# Patient Record
Sex: Male | Born: 1985 | Race: White | Hispanic: No | Marital: Married | Smoking: Never smoker
Health system: Southern US, Community
[De-identification: ages and names within clinical notes are randomized; demographics above are authoritative.]

## PROBLEM LIST (undated history)

## (undated) DIAGNOSIS — F909 Attention-deficit hyperactivity disorder, unspecified type: Secondary | ICD-10-CM

## (undated) DIAGNOSIS — M62838 Other muscle spasm: Secondary | ICD-10-CM

## (undated) DIAGNOSIS — Z87442 Personal history of urinary calculi: Secondary | ICD-10-CM

## (undated) DIAGNOSIS — M778 Other enthesopathies, not elsewhere classified: Secondary | ICD-10-CM

## (undated) DIAGNOSIS — J399 Disease of upper respiratory tract, unspecified: Secondary | ICD-10-CM

## (undated) HISTORY — PX: VASECTOMY: SHX75

## (undated) HISTORY — DX: Personal history of urinary calculi: Z87.442

## (undated) HISTORY — DX: Other muscle spasm: M62.838

## (undated) HISTORY — DX: Other enthesopathies, not elsewhere classified: M77.8

## (undated) HISTORY — DX: Attention-deficit hyperactivity disorder, unspecified type: F90.9

## (undated) HISTORY — DX: Disease of upper respiratory tract, unspecified: J39.9

---
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
## 2013-04-28 ENCOUNTER — Ambulatory Visit: Payer: Self-pay | Admitting: Family Medicine

## 2016-07-10 DIAGNOSIS — K219 Gastro-esophageal reflux disease without esophagitis: Secondary | ICD-10-CM | POA: Insufficient documentation

## 2016-07-26 ENCOUNTER — Encounter: Payer: Self-pay | Admitting: Family Medicine

## 2016-07-26 ENCOUNTER — Ambulatory Visit (INDEPENDENT_AMBULATORY_CARE_PROVIDER_SITE_OTHER): Payer: Self-pay | Admitting: Family Medicine

## 2016-07-26 VITALS — BP 118/84 | HR 108 | Temp 98.9°F | Resp 18 | Ht 71.0 in | Wt 216.0 lb

## 2016-07-26 DIAGNOSIS — J399 Disease of upper respiratory tract, unspecified: Secondary | ICD-10-CM | POA: Insufficient documentation

## 2016-07-26 DIAGNOSIS — M778 Other enthesopathies, not elsewhere classified: Secondary | ICD-10-CM

## 2016-07-26 DIAGNOSIS — J019 Acute sinusitis, unspecified: Secondary | ICD-10-CM | POA: Insufficient documentation

## 2016-07-26 DIAGNOSIS — F988 Other specified behavioral and emotional disorders with onset usually occurring in childhood and adolescence: Secondary | ICD-10-CM

## 2016-07-26 DIAGNOSIS — Z23 Encounter for immunization: Secondary | ICD-10-CM

## 2016-07-26 DIAGNOSIS — R0781 Pleurodynia: Secondary | ICD-10-CM

## 2016-07-26 DIAGNOSIS — K219 Gastro-esophageal reflux disease without esophagitis: Secondary | ICD-10-CM

## 2016-07-26 DIAGNOSIS — F909 Attention-deficit hyperactivity disorder, unspecified type: Secondary | ICD-10-CM | POA: Insufficient documentation

## 2016-07-26 DIAGNOSIS — F902 Attention-deficit hyperactivity disorder, combined type: Secondary | ICD-10-CM

## 2016-07-26 DIAGNOSIS — M62838 Other muscle spasm: Secondary | ICD-10-CM | POA: Insufficient documentation

## 2016-07-26 NOTE — Progress Notes (Addendum)
Name: Patrick Hull   MRN: [id number]    DOB: 03-Aug-1985   Date:07/26/2016       Progress Note  Subjective  Chief Complaint  Chief Complaint  Patient presents with  . 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   Mental health Evaluation ofr concealed weapon: his permit is about to expire, previously evaluation done by Dr. Thana Ates. He denies taking any medication for depression or anxiety. No history of admission for psychiatric problems or suicidal ideation.    Patient Active Problem List   Diagnosis Date Noted  . ADHD 07/26/2016  . Gastroesophageal reflux disease without esophagitis 07/10/2016    Past Surgical History:  Procedure Laterality Date  . TONSILLECTOMY  2007    Family History  Problem Relation Age of Onset  . Hypertension Father     Social History   Social History  . Marital status: Married    Spouse name: Phineas Semen   . Number of children: 1  . Years of education: N/A   Occupational History  . 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 Smoking status: Never Smoker  . Smokeless tobacco: Never Used  . Alcohol use No  . Drug use: No  . Sexual activity: Yes    Partners: Female   Other Topics Concern  . Not on file   Social History Narrative   Married since April 16th, 2016   They have one son      Current Outpatient Prescriptions:  .  ranitidine (ZANTAC) 150 MG tablet, Take by mouth., Disp: , Rfl:   Allergies  Allergen Reactions  . Codeine Nausea Only    Other reaction(s):  Unknown  . Sulfur      ROS  Ten systems reviewed and is negative except as mentioned in HPI   Objective  Vitals:   07/26/16 1002  BP: 118/84  Pulse: (!) 108  Resp: 18  Temp: 98.9 F (37.2 C)  TempSrc: Oral  SpO2: 97%  Weight: 216 lb (98 kg)  Height: 5\' 11"  (1.803 m)    Body mass index is 30.13 kg/m.  Physical Exam  Constitutional: Patient appears well-developed and well-nourished. No distress.  HEENT: head atraumatic, normocephalic, pupils equal and reactive to light,neck supple, throat within normal limits Cardiovascular: Normal rate, regular rhythm and normal heart sounds.  No murmur heard. No BLE edema. Pulmonary/Chest: Effort normal and breath sounds normal. No respiratory distress. Abdominal: Soft.  There is no tenderness. Psychiatric: Patient has a normal mood and affect. behavior is normal. Judgment and thought content normal.  PHQ2/9: Depression screen PHQ 2/9 07/26/2016  Decreased Interest 0  Down, Depressed, Hopeless 0  PHQ - 2 Score 0     Fall Risk: Fall Risk  07/26/2016  Falls in the past year? No    GAD 7 : Generalized Anxiety Score 07/26/2016  Nervous, Anxious, on Edge 0  Control/stop worrying 0  Worry too much -  different things 0  Trouble relaxing 0  Restless 0  Easily annoyed or irritable 0  Afraid - awful might happen 0  Total GAD 7 Score 0  Anxiety Difficulty Not difficult at all     Functional Status Survey: Is the patient deaf or have difficulty hearing?: No Does the patient have difficulty seeing, even when wearing glasses/contacts?: No Does the patient have difficulty concentrating, remembering, or making decisions?: No Does the patient have difficulty walking or climbing stairs?: No Does the patient have difficulty dressing or bathing?: No Does the patient have difficulty doing errands alone such as visiting a doctor's office or shopping?: No   Assessment & Plan  1. Gastroesophageal reflux disease without  esophagitis  Discussed wedge pillow under his mattress.   2. Attention deficit hyperactivity disorder (ADHD), combined type  Not on medication, no other mental diagnosis, no contraindication to carry a concealed weapon   3. Needs flu shot  - Flu Vaccine QUAD 36+ mos IM

## 2016-07-26 NOTE — Addendum Note (Signed)
Addended by: Alba CorySOWLES, Jilleen Essner F on: 07/26/2016 10:48 AM   Modules accepted: Orders

## 2017-03-16 ENCOUNTER — Encounter: Payer: Self-pay | Admitting: Urology

## 2017-03-16 ENCOUNTER — Ambulatory Visit (INDEPENDENT_AMBULATORY_CARE_PROVIDER_SITE_OTHER): Payer: PRIVATE HEALTH INSURANCE | Admitting: Urology

## 2017-03-16 VITALS — BP 146/93 | HR 86 | Ht 71.0 in | Wt 205.2 lb

## 2017-03-16 DIAGNOSIS — S3022XA Contusion of scrotum and testes, initial encounter: Secondary | ICD-10-CM

## 2017-03-16 NOTE — Progress Notes (Signed)
03/16/2017 11:24 AM   Patrick Hull [birth year] [id number]  Referring provider: Alba Cory, MD [address] [place], [place] [address]  No chief complaint on file.   HPI: The patient is a 31 year old gentleman who underwent a vasectomy on 03/01/2017 at the [facility] presents today for a second opinion due to postprocedural swelling.  He was told that hematoma, but he would like a second opinion. Approximately 4 days after his procedure he notices swelling that got progressively worse and more painful on the left side. It is very uncomfortable from the wall. Has not been able to do his usual daily activities. It has gotten slightly better in the last couple days. He says it is approximately 20% smaller. He denies any drainage or purulent discharge.   PMH: Past Medical History:  Diagnosis Date  . ADHD   . Muscle spasm   . Tendinitis of right wrist   . Upper respiratory disease     Surgical History: Past Surgical History:  Procedure Laterality Date  . TONSILLECTOMY  2007    Home Medications:  Allergies as of 03/16/2017      Reactions   Codeine Nausea Only   Other reaction(s): Unknown   Sulfur       Medication List       Accurate as of 03/16/17 11:24 AM. Always use your most recent med list.          ranitidine 150 MG tablet Commonly known as:  ZANTAC Take by mouth.       Allergies:  Allergies  Allergen Reactions  . Codeine Nausea Only    Other reaction(s): Unknown  . Sulfur     Family History: Family History  Problem Relation Age of Onset  . Hypertension Father     Social History:  reports that he has never smoked. He has never used smokeless tobacco. He reports that he does not drink alcohol or use drugs.  ROS:                                        Physical Exam: There were no vitals taken for this visit.  Constitutional:  Alert and oriented, No acute distress. HEENT: [place] AT, moist mucus membranes.   Trachea midline, no masses. Cardiovascular: No clubbing, cyanosis, or edema. Respiratory: Normal respiratory effort, no increased work of breathing. GI: Abdomen is soft, nontender, nondistended, no abdominal masses GU: No CVA tenderness. Normal phallus. Right testicle unremarkable. He does have a apparent hematoma rounding his left testicle. It is tender to palpation. There is no sign of infection. No crepitus. No erythema. No drainage. Incisions are intact. Skin: No rashes, bruises or suspicious lesions. Lymph: No cervical or inguinal adenopathy. Neurologic: Grossly intact, no focal deficits, moving all 4 extremities. Psychiatric: Normal mood and affect.  Laboratory Data: No results found for: WBC, HGB, HCT, MCV, PLT  No results found for: CREATININE  No results found for: PSA  No results found for: TESTOSTERONE  No results found for: HGBA1C  Urinalysis No results found for: COLORURINE, APPEARANCEUR, LABSPEC, PHURINE, GLUCOSEU, HGBUR, BILIRUBINUR, KETONESUR, PROTEINUR, UROBILINOGEN, NITRITE, LEUKOCYTESUR   Assessment & Plan:   1. Scrotal hematoma status post vasectomy I reassured the patient that this is a common occurrence after a vasectomy. I do not think there is any sign of infection at this time. I recommended conservative measures with scheduled NSAIDs, icing of the scrotum,  and tight fitting underwear. I did explain that this may take up to a full month to fully resolve. All questions were answered. The patient will follow-up with [place] as needed.  No Follow-up on file.  Hildred Laser, MD  [facility] [address], [address] [place], [place] [address] [phone]

## 2017-10-12 ENCOUNTER — Ambulatory Visit: Payer: PRIVATE HEALTH INSURANCE | Admitting: Family Medicine

## 2017-12-01 ENCOUNTER — Encounter: Payer: Self-pay | Admitting: *Deleted

## 2017-12-01 ENCOUNTER — Emergency Department
Admission: EM | Admit: 2017-12-01 | Discharge: 2017-12-01 | Disposition: A | Discharge status: Home / Self Care | Payer: PRIVATE HEALTH INSURANCE | Attending: Emergency Medicine | Admitting: Emergency Medicine

## 2017-12-01 ENCOUNTER — Other Ambulatory Visit: Payer: Self-pay

## 2017-12-01 DIAGNOSIS — L509 Urticaria, unspecified: Secondary | ICD-10-CM | POA: Insufficient documentation

## 2017-12-01 DIAGNOSIS — R21 Rash and other nonspecific skin eruption: Secondary | ICD-10-CM | POA: Insufficient documentation

## 2017-12-01 MED ORDER — PREDNISONE 20 MG PO TABS: 40.0000 mg | ORAL_TABLET | Freq: Every day | ORAL | 0 refills | Status: DC

## 2017-12-01 MED ORDER — FAMOTIDINE 20 MG PO TABS
20.0000 mg | ORAL_TABLET | Freq: Once | ORAL | Status: AC
  Administered 2017-12-01: 20 mg via ORAL
  Filled 2017-12-01: qty 1

## 2017-12-01 MED ORDER — FAMOTIDINE 20 MG PO TABS: 20.0000 mg | ORAL_TABLET | Freq: Two times a day (BID) | ORAL | 0 refills | Status: DC

## 2017-12-01 MED ORDER — PREDNISONE 20 MG PO TABS
40.0000 mg | ORAL_TABLET | Freq: Once | ORAL | Status: AC
Start: 2017-12-01 — End: 2017-12-01
  Administered 2017-12-01: 40 mg via ORAL
  Filled 2017-12-01: qty 4

## 2017-12-01 MED ORDER — DIPHENHYDRAMINE HCL 25 MG PO TABS: 25.0000 mg | ORAL_TABLET | Freq: Three times a day (TID) | ORAL | 0 refills | Status: DC | PRN

## 2017-12-01 NOTE — Discharge Instructions (Addendum)
Please take the entire course of prednisone and Pepcid, even if you are feeling better.  Return to the emergency department if you develop increasing rash, shortness of breath, drooling, wheezing, lightheadedness or fainting, swelling of the mouth, tongue, or lips, or for any other symptoms concerning to you.

## 2017-12-01 NOTE — ED Triage Notes (Signed)
Pt arrives to ED with c/o hives. He was seen here this morning for the same, states they went away but have returned on his forehead this evening. He took benadryl at 1830 and again at 2030. Pt states he has anxiety and this is making it worse. Alert, oriented,speech/ lungs clear, no distress.

## 2017-12-01 NOTE — ED Notes (Signed)
Report to angela, rn. 

## 2017-12-01 NOTE — ED Provider Notes (Signed)
[facility] Emergency Department Provider Note  ____________________________________________  Time seen: Approximately 7:42 AM  I have reviewed the triage vital signs and the nursing notes.   HISTORY  Chief Complaint Urticaria    HPI Patrick Hull is a 32 y.o. 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  The patient reports that he developed an urticarial rash on the wrists, then noted to go to the bilateral arms, chest, flank, and back at 7:30 PM last night.  The rash was raised and itchy and resolved spontaneously.  At 4 AM, he woke up with return of his rash which was worsening, took 1 tablet of Benadryl, and came to the emergency department.  He denies any swelling of the face, mouth or tongue, shortness of breath or drooling, change in his voice, wheezing, lightheadedness or syncope.  At this time, the patient feels that his symptoms are "almost gone."  He denies any new foods, medications, soaps or detergents yesterday.  He did take a box of insulation and dumped it out at 1 PM, resulting in a large clot of dust.  He had a similar episode approximately 8 years ago but did not seek medical attention or know the cause.  Past Medical History:  Diagnosis Date  . ADHD   . Muscle spasm   . Tendinitis of right wrist   . Upper respiratory disease     Patient Active Problem List   Diagnosis Date Noted  . ADHD 07/26/2016  . Gastroesophageal reflux disease without esophagitis 07/10/2016    Past Surgical History:  Procedure Laterality Date  . TONSILLECTOMY  2007    Current Outpatient Rx  . Order #: [id number] Class: Print  . Order #: [id number] Class: Print  . Order #: [id number] Class: Print  . Order #: [id number] Class: Historical Med    Allergies Codeine and Sulfur  Family History  Problem Relation Age of Onset  . Hypertension Father   . Prostate cancer Neg Hx   . Bladder Cancer Neg Hx   . Kidney  cancer Neg Hx     Social History Social History   Tobacco Use  . Smoking status: Never Smoker  . Smokeless tobacco: Never Used  Substance Use Topics  . Alcohol use: No  . Drug use: No    Review of Systems Constitutional: No fever/chills.  Lightheadedness or syncope. Eyes: No visual changes. ENT: No sore throat. No congestion or rhinorrhea.  No lip, mouth, or tongue swelling.  No drooling, or stridor.  No change in voice. Cardiovascular: Denies chest pain. Denies palpitations. Respiratory: Denies shortness of breath.  No cough.  No wheezing. Gastrointestinal: No abdominal pain.  No nausea, no vomiting.  No diarrhea.  No constipation. Genitourinary: Negative for dysuria. Musculoskeletal: Negative for back pain. Skin: Positive for your urticarial rash. Neurological: Negative for headaches. No focal numbness, tingling or weakness.     ____________________________________________   PHYSICAL EXAM:  VITAL SIGNS: ED Triage Vitals  Enc Vitals Group     BP 12/01/17 0456 137/78     Pulse Rate 12/01/17 0456 63     Resp 12/01/17 0456 16     Temp 12/01/17 0456 97.6 F (36.4 C)     Temp Source 12/01/17 0456 Oral     SpO2 12/01/17 0456 98 %     Weight --      Height --      Head Circumference --      Peak Flow --  Pain Score 12/01/17 0455 0     Pain Loc --      Pain Edu? --      Excl. in GC? --     Constitutional: Alert and oriented. Well appearing and in no acute distress. Answers questions appropriately. Eyes: Conjunctivae are normal.  EOMI. No scleral icterus. Head: Atraumatic. Nose: No congestion/rhinnorhea. Mouth/Throat: Mucous membranes are moist.  No swelling of the lips, tongue.  The submandibular space is soft.  The posterior pharynx is without any swelling.  The palate has a normal appearance without any asymmetry and the uvula is midline.  There is no hoarse voice, stridor, or drooling. Neck: No stridor.  Supple.  No meningismus. Cardiovascular: Normal rate,  regular rhythm. No murmurs, rubs or gallops.  Respiratory: Normal respiratory effort.  No accessory muscle use or retractions. Lungs CTAB.  No wheezes, rales or ronchi. Gastrointestinal: Soft, nontender and nondistended.  No guarding or rebound.  No peritoneal signs. Musculoskeletal: No LE edema. Neurologic:  A&Ox3.  Speech is clear.  Face and smile are symmetric.  EOMI.  Moves all extremities well. Skin: The patient has multiple areas of small urticarial wheals, noted on the bilateral wrists, forearms, right side of the back, right flank, and right abdomen. Psychiatric: Mood and affect are normal. Speech and behavior are normal.  Normal judgement.  ____________________________________________   LABS (all labs ordered are listed, but only abnormal results are displayed)  Labs Reviewed - No data to display ____________________________________________  EKG  Not indicated ____________________________________________  RADIOLOGY  No results found.  ____________________________________________   PROCEDURES  Procedure(s) performed: None  Procedures  Critical Care performed: No ____________________________________________   INITIAL IMPRESSION / ASSESSMENT AND PLAN / ED COURSE  Pertinent labs & imaging results that were available during my care of the patient were reviewed by me and considered in my medical decision making (see chart for details).  32 y.o. 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  Overall, the patient is hemodynamically stable.  He has no evidence of hemodynamic collapse, anaphylaxis, or airway compromise at this time.  However, given that he has had 2 separate episodes of worsening urticaria, we will plan to treat him with prednisone, Pepcid and Benadryl.  He will be discharged home  with a 5-day course of prednisone and Pepcid, Benadryl as needed, and close follow-up with the PMD.  I did discuss return precautions with the patient.  ____________________________________________  FINAL CLINICAL IMPRESSION(S) / ED DIAGNOSES  Final diagnoses:  Urticaria         NEW MEDICATIONS STARTED DURING THIS VISIT:  New Prescriptions   DIPHENHYDRAMINE (BENADRYL) 25 MG TABLET    Take 1-2 tablets (25-50 mg total) by mouth every 8 (eight) hours as needed for allergies.   FAMOTIDINE (PEPCID) 20 MG TABLET    Take 1 tablet (20 mg total) by mouth 2 (two) times daily.   PREDNISONE (DELTASONE) 20 MG TABLET    Take 2 tablets (40 mg total) by mouth daily.      Rockne MenghiniNorman, Anne-Caroline, MD 12/01/17 1447

## 2017-12-01 NOTE — ED Triage Notes (Signed)
Pt says that he started with hives on his wrists last night around 1930. No new product use, medications, or foods. Pt does say that he dumped insulation yesterday and a cloud of dust came back towards him. He currently denies any mouth/throat swelling, difficulty swallowing, or change in his breathing. Last took 1 tab benadryl around 0400. Hives have progressed generalized through the night

## 2017-12-02 ENCOUNTER — Emergency Department
Admission: EM | Admit: 2017-12-02 | Discharge: 2017-12-02 | Disposition: A | Discharge status: Home / Self Care | Payer: PRIVATE HEALTH INSURANCE | Source: Home / Self Care | Attending: Emergency Medicine | Admitting: Emergency Medicine

## 2017-12-02 DIAGNOSIS — L509 Urticaria, unspecified: Secondary | ICD-10-CM

## 2017-12-02 MED ORDER — EPINEPHRINE 0.3 MG/0.3ML IJ SOAJ: 0.3000 mg | Freq: Once | INTRAMUSCULAR | 0 refills | Status: AC

## 2017-12-02 MED ORDER — DEXAMETHASONE SODIUM PHOSPHATE 10 MG/ML IJ SOLN
INTRAMUSCULAR | Status: AC
  Filled 2017-12-02: qty 1

## 2017-12-02 MED ORDER — DEXAMETHASONE SODIUM PHOSPHATE 10 MG/ML IJ SOLN
10.0000 mg | Freq: Once | INTRAMUSCULAR | Status: AC
  Administered 2017-12-02: 10 mg via INTRAMUSCULAR

## 2017-12-02 NOTE — Discharge Instructions (Addendum)
Please follow up with an allergist to evaluate your symptoms. Please return with any worsening condition or any other concern.

## 2017-12-02 NOTE — ED Notes (Signed)
Patient reports rash to feet, legs, chest, back, arms and face. Rash has since resolved. Patient reports continued pain/itching to rash sites.

## 2017-12-02 NOTE — ED Provider Notes (Signed)
[facility] Emergency Department Provider Note   ____________________________________________   First MD Initiated Contact with Patient 12/02/17 0030     (approximate)  I have reviewed the triage vital signs and the nursing notes.   HISTORY  Chief Complaint Urticaria    HPI Patrick Hull is a 32 y.o. male who comes into the hospital today with hives all over.  The patient was here at about 445 this morning with the same problem.  He states he was seen by Dr. on 730 and was given medicines and discharged home.  He states though that this morning by the time he was seen all of his hives had gone away.  He says that he was fine during the day and even went to the zoo.  He states around 5:00 he started feeling as though the hives were coming back.  He took a Benadryl around 6 and then took another one about an hour to an hour and a half later.  He noticed some redness to his face and his wife states that it did look like his lips were blue.  The patient was short of breath although he is unsure if it was due to the reaction or panic.  The patient started looking things up on Google to see what was causing his symptoms and became very nervous.  He decided to come back into the hospital for further evaluation.  He reports that once he arrived the hives did go away again.  He denies any new foods.  The patient says that his wife did use a new fabric softener but he did not use anything washed and that today.  He also reports that there was some work done in the house and he had thrown away some fiberglass but nothing ever went into the house.  He is here for evaluation.  Past Medical History:  Diagnosis Date  . ADHD   . Muscle spasm   . Tendinitis of right wrist   . Upper respiratory disease     Patient Active Problem List   Diagnosis Date Noted  . ADHD 07/26/2016  . Gastroesophageal reflux disease without esophagitis 07/10/2016    Past Surgical History:    Procedure Laterality Date  . TONSILLECTOMY  2007    Prior to Admission medications   Medication Sig Start Date End Date Taking? Authorizing Provider  diphenhydrAMINE (BENADRYL) 25 MG tablet Take 1-2 tablets (25-50 mg total) by mouth every 8 (eight) hours as needed for allergies. 12/01/17   Rockne MenghiniNorman, Anne-Caroline, MD  EPINEPHrine 0.3 mg/0.3 mL IJ SOAJ injection Inject 0.3 mLs (0.3 mg total) into the muscle once for 1 dose. As needed for allergic reaction with facial swelling or respiratory difficulties 12/02/17 12/02/17  Rebecka ApleyWebster, Rushton Early P, MD  famotidine (PEPCID) 20 MG tablet Take 1 tablet (20 mg total) by mouth 2 (two) times daily. 12/01/17 12/01/18  Rockne MenghiniNorman, Anne-Caroline, MD  predniSONE (DELTASONE) 20 MG tablet Take 2 tablets (40 mg total) by mouth daily. 12/01/17   Rockne MenghiniNorman, Anne-Caroline, MD  ranitidine (ZANTAC) 150 MG tablet Take by mouth.    [provider]    Allergies Codeine and Sulfur  Family History  Problem Relation Age of Onset  . Hypertension Father   . Prostate cancer Neg Hx   . Bladder Cancer Neg Hx   . Kidney cancer Neg Hx     Social History Social History   Tobacco Use  . Smoking status: Never Smoker  . Smokeless tobacco: Never Used  Substance Use Topics  . Alcohol use: No  . Drug use: No    Review of Systems  Constitutional: No fever/chills Eyes: No visual changes. ENT: No sore throat. Cardiovascular: Denies chest pain. Respiratory: Denies shortness of breath. Gastrointestinal: No abdominal pain.  No nausea, no vomiting.  No diarrhea.  No constipation. Genitourinary: Negative for dysuria. Musculoskeletal: Negative for back pain. Skin: Hives Neurological: Negative for headaches, focal weakness or numbness.   ____________________________________________   PHYSICAL EXAM:  VITAL SIGNS: ED Triage Vitals [12/01/17 2343]  Enc Vitals Group     BP (!) 149/83     Pulse Rate 99     Resp (!) 22     Temp (!) 97.5 F (36.4 C)     Temp Source Oral      SpO2 99 %     Weight      Height      Head Circumference      Peak Flow      Pain Score 0     Pain Loc      Pain Edu?      Excl. in GC?     Constitutional: Alert and oriented. Well appearing and in mild distress. Eyes: Conjunctivae are normal. PERRL. EOMI. Head: Atraumatic. Nose: No congestion/rhinnorhea. Mouth/Throat: Mucous membranes are moist.  Oropharynx non-erythematous. Cardiovascular: Normal rate, regular rhythm. Grossly normal heart sounds.  Good peripheral circulation. Respiratory: Normal respiratory effort.  No retractions. Lungs CTAB. Gastrointestinal: Soft and nontender. No distention.  Positive bowel sounds Musculoskeletal: No lower extremity tenderness nor edema.   Neurologic:  Normal speech and language.  Skin:  Skin is warm, dry and intact.  No hives noted currently, mild erythematous macule noted to epigastric area, some erythematous macules noted to left knee and hand but they are not raised and do not appear to be hives.  On previous pictures shown by patient did have a raised appearance such as hives. Psychiatric: Mood and affect are normal. Speech and behavior are normal.  ____________________________________________   LABS (all labs ordered are listed, but only abnormal results are displayed)  Labs Reviewed - No data to display ____________________________________________  EKG  none ____________________________________________  RADIOLOGY  ED MD interpretation:  none  Official radiology report(s): No results found.  ____________________________________________   PROCEDURES  Procedure(s) performed: None  Procedures  Critical Care performed: No  ____________________________________________   INITIAL IMPRESSION / ASSESSMENT AND PLAN / ED COURSE  As part of my medical decision making, I reviewed the following data within the electronic MEDICAL RECORD NUMBER Notes from prior ED visits and [place] Controlled Substance Database   This is a  32 year old male who comes into the hospital today with some recurrence of his hives.  The patient already has received 2 doses of Benadryl, prednisone, Pepcid and his hives are improved.  I am concerned that the patient may be having some re-exposure to his allergen at home.  The patient's hives though have improved with some Benadryl and he is not having any respiratory distress.  I will give the patient a dose of Decadron which may give him some longer coverage and the prednisone and I will discharge him with an EpiPen in the event that he has any respiratory symptoms due to this allergy.  The patient will be encouraged to follow-up with an allergist.  The family understands the plans as stated.  He will be discharged home.      ____________________________________________   FINAL CLINICAL IMPRESSION(S) / ED DIAGNOSES  Final diagnoses:  Hives     ED Discharge Orders        Ordered    EPINEPHrine 0.3 mg/0.3 mL IJ SOAJ injection   Once     12/02/17 0133       Note:  This document was prepared using Dragon voice recognition software and may include unintentional dictation errors.    Rebecka Apley, MD 12/02/17 [id number]

## 2018-01-21 DIAGNOSIS — M7581 Other shoulder lesions, right shoulder: Secondary | ICD-10-CM | POA: Insufficient documentation

## 2018-01-21 DIAGNOSIS — S43431A Superior glenoid labrum lesion of right shoulder, initial encounter: Secondary | ICD-10-CM | POA: Insufficient documentation

## 2018-02-11 ENCOUNTER — Encounter: Payer: Self-pay | Admitting: Family Medicine

## 2018-02-11 ENCOUNTER — Ambulatory Visit: Payer: PRIVATE HEALTH INSURANCE | Admitting: Family Medicine

## 2018-02-11 VITALS — BP 108/60 | HR 78 | Temp 97.6°F | Resp 16 | Ht 71.0 in | Wt 197.1 lb

## 2018-02-11 DIAGNOSIS — R002 Palpitations: Secondary | ICD-10-CM

## 2018-02-11 DIAGNOSIS — F411 Generalized anxiety disorder: Secondary | ICD-10-CM | POA: Diagnosis not present

## 2018-02-11 DIAGNOSIS — Z13220 Encounter for screening for lipoid disorders: Secondary | ICD-10-CM

## 2018-02-11 DIAGNOSIS — F410 Panic disorder [episodic paroxysmal anxiety] without agoraphobia: Secondary | ICD-10-CM

## 2018-02-11 DIAGNOSIS — Z131 Encounter for screening for diabetes mellitus: Secondary | ICD-10-CM | POA: Diagnosis not present

## 2018-02-11 MED ORDER — ATENOLOL 25 MG PO TABS
12.5000 mg | ORAL_TABLET | Freq: Every day | ORAL | 0 refills | Status: DC
Start: 2018-02-11 — End: 2018-03-07

## 2018-02-11 MED ORDER — ESCITALOPRAM OXALATE 10 MG PO TABS
10.0000 mg | ORAL_TABLET | Freq: Every day | ORAL | 0 refills | Status: DC
Start: 2018-02-11 — End: 2018-03-07

## 2018-02-11 NOTE — Patient Instructions (Signed)

## 2018-02-11 NOTE — Progress Notes (Signed)
Name: Patrick Hull   MRN: [id number]    DOB: [birth year]   Date:02/11/2018       Progress Note  Subjective  Chief Complaint  Chief Complaint  Patient presents with  . Anxiety    Gets chest tightness and feels like his esophagus gets dilated. His wife has Anxiety attacks as well and identifies them similiar to hers. States it will just happen and has to walk away. Buspar gave him bad headaches.    HPI  GAD/Panic attacks: history of ADHD diagnosed by Dierdre Highman clinical psychologist back in 2012 when he started seeing her after the death of his grandfather. He states took a stimulant for a period of time and felt great. He was able to focus more, mind was not as busy but does not like taking medications and stopped on his own. Since Fall on 2018 he has not been feeling well. He states it all started after he had complications of vasectomy Fall 2018 from a large hematoma formation. After that had problems with his ear drum while on cruise. Followed by episodes of palpitation, SOB and hives that required two visit to [facility] Since than seen by Cardiologist , Dr. Lady Gary and also Pulmonologist , Dr. Meredeth Ide and finally Dr. Meredeth Ide diagnosed him with anxiety and gave him Buspar. He took 3 days of medication but had to stop because of headache. He states mind always busy, difficulty relaxing, feels overwhelmed, difficulty falling and staying asleep. He feels like something will happen to him and worries him.    Patient Active Problem List   Diagnosis Date Noted  . Tendinitis of right rotator cuff 01/21/2018  . Tear of right glenoid labrum 01/21/2018  . ADHD 07/26/2016  . Gastroesophageal reflux disease without esophagitis 07/10/2016    Past Surgical History:  Procedure Laterality Date  . TONSILLECTOMY  2007    Family History  Problem Relation Age of Onset  . Hypertension Father   . Prostate cancer Neg Hx   . Bladder Cancer Neg Hx   . Kidney cancer Neg Hx     Social History   Socioeconomic  History  . Marital status: Married    Spouse name: Phineas Semen   . Number of children: 1  . Years of education: Not on file  . Highest education level: Not on file  Occupational History  . 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 Financial resource strain: Not on file  . Food insecurity:    Worry: Not on file    Inability: Not on file  . Transportation needs:    Medical: Not on file    Non-medical: Not on file  Tobacco Use  . Smoking status: Never Smoker  . Smokeless tobacco: Never Used  Substance and Sexual Activity  . Alcohol use: No  . Drug use: No  . Sexual activity: Yes    Partners: Female  Lifestyle  . Physical activity:    Days per week: 4 days    Minutes per session: 60 min  . Stress: Very much  Relationships  . Social connections:    Talks on phone: Not on file    Gets together: Not on file    Attends religious service: Not on file    Active member of club or organization: Not on file    Attends meetings of clubs or organizations: Not on file    Relationship status: Not on file  . Intimate partner violence:  Fear of current or ex partner: No    Emotionally abused: No    Physically abused: No    Forced sexual activity: No  Other Topics Concern  . 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      Current Outpatient Medications:  .  diphenhydrAMINE (BENADRYL) 25 MG tablet, Take 1-2 tablets (25-50 mg total) by mouth every 8 (eight) hours as needed for allergies., Disp: 15 tablet, Rfl: 0 .  EPINEPHrine 0.3 mg/0.3 mL IJ SOAJ injection, , Disp: , Rfl:  .  busPIRone (BUSPAR) 5 MG tablet, Take by mouth., Disp: , Rfl:   Allergies  Allergen Reactions  . Buspar [Buspirone]     headache  . Codeine Nausea Only    Other reaction(s): Unknown  . Sulfur      ROS  Constitutional: Negative for  fever or weight change.  Respiratory: Negative for cough, positive for intermittent  shortness of breath.   Cardiovascular: Negative for chest pain , positive for intermittent palpitations.  Gastrointestinal: Negative for abdominal pain, no bowel changes.  Musculoskeletal: Negative for gait problem or joint swelling.  Skin: Negative for rash.  Neurological: Negative for dizziness or headache.  No other specific complaints in a complete review of systems (except as listed in HPI above).  Objective  Vitals:   02/11/18 0952  BP: 108/60  Pulse: 78  Resp: 16  Temp: 97.6 F (36.4 C)  TempSrc: Oral  SpO2: 98%  Weight: 197 lb 1.6 oz (89.4 kg)  Height: 5\' 11"  (1.803 m)    Body mass index is 27.49 kg/m.  Physical Exam  Constitutional: Patient appears well-developed and well-nourished. Overweight. No distress.  HEENT: head atraumatic, normocephalic, pupils equal and reactive to light, neck supple, throat within normal limits Cardiovascular: Normal rate, regular rhythm and normal heart sounds.  No murmur heard. No BLE edema. Pulmonary/Chest: Effort normal and breath sounds normal. No respiratory distress. Abdominal: Soft.  There is no tenderness. Psychiatric: Patient has a normal mood and affect. behavior is normal. Judgment and thought content normal.   PHQ2/9: Depression screen [facility] 2/9 02/11/2018 07/26/2016  Decreased Interest 0 0  Down, Depressed, Hopeless 1 0  PHQ - 2 Score 1 0  Altered sleeping 2 -  Tired, decreased energy 1 -  Change in appetite 0 -  Feeling bad or failure about yourself  1 -  Trouble concentrating 0 -  Moving slowly or fidgety/restless 0 -  Suicidal thoughts 0 -  PHQ-9 Score 5 -  Difficult doing work/chores Not difficult at all -    GAD 7 : Generalized Anxiety Score 02/11/2018 07/26/2016  Nervous, Anxious, on Edge 3 0  Control/stop worrying 3 0  Worry too much - different things 2 0  Trouble relaxing 3 0  Restless 2 0  Easily annoyed or irritable 2 0   Afraid - awful might happen 3 0  Total GAD 7 Score 18 0  Anxiety Difficulty Very difficult Not difficult at all     Fall Risk: Fall Risk  02/11/2018 07/26/2016  Falls in the past year? No No    Functional Status Survey: Is the patient deaf or have difficulty hearing?: No Does the patient have difficulty seeing, even when wearing glasses/contacts?: No Does the patient have difficulty concentrating, remembering, or making decisions?: No Does the patient have difficulty walking or  climbing stairs?: No Does the patient have difficulty dressing or bathing?: No Does the patient have difficulty doing errands alone such as visiting a doctor's office or shopping?: No    Assessment & Plan  1. GAD (generalized anxiety disorder)  - escitalopram (LEXAPRO) 10 MG tablet; Take 1 tablet (10 mg total) by mouth daily.  Dispense: 30 tablet; Refill: 0 - CBC with Differential/Platelet - COMPLETE METABOLIC PANEL WITH GFR  2. Panic attack  - escitalopram (LEXAPRO) 10 MG tablet; Take 1 tablet (10 mg total) by mouth daily.  Dispense: 30 tablet; Refill: 0  3. Palpitation  - atenolol (TENORMIN) 25 MG tablet; Take 0.5 tablets (12.5 mg total) by mouth at bedtime.  Dispense: 15 tablet; Refill: 0 - CBC with Differential/Platelet - COMPLETE METABOLIC PANEL WITH GFR  4. Screening for diabetes mellitus  - Hemoglobin A1c  5. Lipid screening  - Lipid panel

## 2018-02-12 ENCOUNTER — Other Ambulatory Visit: Payer: Self-pay | Admitting: Family Medicine

## 2018-02-12 ENCOUNTER — Ambulatory Visit: Payer: Self-pay | Admitting: *Deleted

## 2018-02-12 ENCOUNTER — Telehealth: Payer: Self-pay | Admitting: Emergency Medicine

## 2018-02-12 DIAGNOSIS — R002 Palpitations: Secondary | ICD-10-CM

## 2018-02-12 MED ORDER — HYDROXYZINE HCL 10 MG PO TABS: 10.0000 mg | ORAL_TABLET | Freq: Three times a day (TID) | ORAL | 0 refills | Status: DC | PRN

## 2018-02-12 NOTE — Telephone Encounter (Signed)
Patient was advised to take the Hydroxyzine as directed my Dr. Carlynn PurlSowles and then resume Lexapro (1/2) dose on tomorrow. Patient was encouraged to give us a call tomorrow if things did not improve. He said ok and thanks

## 2018-02-12 NOTE — Telephone Encounter (Addendum)
Pt called with complaints of "feeling like he is on cloud 9" after taking 1st dose of lexapro; " the pt says "he feels like he is freaking out right now"; he also describes the feeling as "having too much caffeine"; recommendations made per nurse triage protocol to include seeing a physician within 24 hours; conference call initiated with Myriam JacobsonHelen, RN at [facility], pt advised to skip tomorrow's dose 02/13/18, and then take 1/2 tablet in the afternoon of 02/14/18 Dr Carlynn PurlSowles will call something in another prescription; Myriam JacobsonHelen also say pt that he can [facility] today and see a Veterinary surgeoncounselor; Myriam JacobsonHelen will also call the pt with the date of his follow up appointment date; the pt verbalizes understanding; will route to office for notification of this encounter Reason for Disposition . Symptoms interfere with work or school  Answer Assessment - Initial Assessment Questions 1. CONCERN: "What happened that made you call today?"     "Feels like he is freaking out" 2. ANXIETY SYMPTOM SCREENING: "Can you describe how you have been feeling?"  (e.g., tense, restless, panicky, anxious, keyed up, trouble sleeping, trouble concentrating)   Anxious, panicky, freaking out  3. ONSET: "How long have you been feeling this way?"     02/12/18 0730 4. RECURRENT: "Have you felt this way before?"  If yes: "What happened that time?" "What helped these feelings go away in the past?"      Seen in office on 02/12/18 5. RISK OF HARM - SUICIDAL IDEATION:  "Do you ever have thoughts of hurting or killing yourself?"  (e.g., yes, no, no but preoccupation with thoughts about death)   - INTENT:  "Do you have thoughts of hurting or killing yourself right NOW?" (e.g., yes, no, N/A)   - PLAN: "Do you have a specific plan for how you would do this?" (e.g., gun, knife, overdose, no plan, N/A)     no 6. RISK OF HARM - HOMICIDAL IDEATION:  "Do you ever have thoughts of hurting or killing someone else?"  (e.g., yes, no, no but preoccupation with thoughts about  death)   - INTENT:  "Do you have thoughts of hurting or killing someone right NOW?" (e.g., yes, no, N/A)   - PLAN: "Do you have a specific plan for how you would do this?" (e.g., gun, knife, no plan, N/A)      no 7. FUNCTIONAL IMPAIRMENT: "How have things been going for you overall in your life? Have you had any more difficulties than usual doing your normal daily activities?"  (e.g., better, same, worse; self-care, school, work, interactions)      Everything going good; just a lot going on" 8. SUPPORT: "Who is with you now?" "Who do you live with?" "Do you have family or friends nearby who you can talk to?"      Pt is at work ;Pt's father on site 819. THERAPIST: "Do you have a counselor or therapist? Name?"     no 10. STRESSORS: "Has there been any new stress or recent changes in your life?"       no 11. CAFFEINE ABUSE: "Do you drink caffeinated beverages, and how much each day?" (e.g., coffee, tea, colas)      Yes 12 oz red bull daily; last 02/11/18 12. SUBSTANCE ABUSE: "Do you use any illegal drugs or alcohol?"       no 13. OTHER SYMPTOMS: "Do you have any other physical symptoms right now?" (e.g., chest pain, palpitations, difficulty breathing, fever)       no 14. PREGNANCY: "  Is there any chance you are pregnant?" "When was your last menstrual period?"       n/a  Protocols used: ANXIETY AND PANIC ATTACK-A-AH

## 2018-02-12 NOTE — Telephone Encounter (Signed)
Patient called and stated that while taking the Lexapro he was feeling borderline light headed like he had to much caffeine. He stated he was not feeling good on the medication. Per Dr. Carlynn PurlSowles patient need to skip one dose of Lexapro for tomorrow 8/28 and start on 1/2 the dose of Lexapro on 8/29. Dr. Carlynn PurlSowles also called patient in Hydroxyxine 25 mg tablets 1 tid prn for anxiety. Patient was also given information and directions to [facility] to see a counselor today. Patient understood and will call back if he have any issues. Appointment was made for patien to return in 2 weeks.

## 2018-02-12 NOTE — Telephone Encounter (Signed)
Pt called back in and wanted to know if he is suppose to start new meds today or wait till other meds are out of system.  He would like the nurse to call him back with the instructions   Call back number [phone]

## 2018-02-12 NOTE — Telephone Encounter (Signed)
He can take hydroxyzine today. He can wait until tomorrow to resume half dose of lexapro.  Hydroxyzine could cause sedation and he should not drive while taking it

## 2018-02-12 NOTE — Telephone Encounter (Signed)
Please advise 

## 2018-02-13 LAB — CBC WITH DIFFERENTIAL/PLATELET
BASOS ABS: 58 {cells}/uL (ref 0–200)
Basophils Relative: 0.8 %
EOS PCT: 3.2 %
Eosinophils Absolute: 230 cells/uL (ref 15–500)
HCT: 44.8 % (ref 38.5–50.0)
Hemoglobin: 15.6 g/dL (ref 13.2–17.1)
Lymphs Abs: 2570 cells/uL (ref 850–3900)
MCH: 30.2 pg (ref 27.0–33.0)
MCHC: 34.8 g/dL (ref 32.0–36.0)
MCV: 86.7 fL (ref 80.0–100.0)
MONOS PCT: 7.2 %
MPV: 10.3 fL (ref 7.5–12.5)
Neutro Abs: 3823 cells/uL (ref 1500–7800)
Neutrophils Relative %: 53.1 %
PLATELETS: 227 10*3/uL (ref 140–400)
RBC: 5.17 10*6/uL (ref 4.20–5.80)
RDW: 12.5 % (ref 11.0–15.0)
Total Lymphocyte: 35.7 %
WBC mixed population: 518 cells/uL (ref 200–950)
WBC: 7.2 10*3/uL (ref 3.8–10.8)

## 2018-02-13 LAB — TEST AUTHORIZATION

## 2018-02-13 LAB — COMPLETE METABOLIC PANEL WITH GFR
AG Ratio: 1.8 (calc) (ref 1.0–2.5)
ALKALINE PHOSPHATASE (APISO): 99 U/L (ref 40–115)
ALT: 36 U/L (ref 9–46)
AST: 28 U/L (ref 10–40)
Albumin: 4.8 g/dL (ref 3.6–5.1)
BUN: 19 mg/dL (ref 7–25)
CO2: 25 mmol/L (ref 20–32)
CREATININE: 0.9 mg/dL (ref 0.60–1.35)
Calcium: 9.6 mg/dL (ref 8.6–10.3)
Chloride: 104 mmol/L (ref 98–110)
GFR, Est African American: 132 mL/min/{1.73_m2} (ref >=60)
GFR, Est Non African American: 114 mL/min/{1.73_m2} (ref >=60)
GLUCOSE: 83 mg/dL (ref 65–139)
Globulin: 2.6 g/dL (calc) (ref 1.9–3.7)
Potassium: 3.8 mmol/L (ref 3.5–5.3)
SODIUM: 139 mmol/L (ref 135–146)
Total Bilirubin: 0.5 mg/dL (ref 0.2–1.2)
Total Protein: 7.4 g/dL (ref 6.1–8.1)

## 2018-02-13 LAB — LIPID PANEL
CHOL/HDL RATIO: 3.7 (calc) (ref <5.0)
CHOLESTEROL: 183 mg/dL (ref <200)
HDL: 49 mg/dL (ref >40)
LDL Cholesterol (Calc): 113 mg/dL (calc) — ABNORMAL HIGH
Non-HDL Cholesterol (Calc): 134 mg/dL (calc) — ABNORMAL HIGH (ref <130)
TRIGLYCERIDES: 99 mg/dL (ref <150)

## 2018-02-13 LAB — HEMOGLOBIN A1C
Hgb A1c MFr Bld: 5.3 % of total Hgb (ref <5.7)
Mean Plasma Glucose: 105 (calc)
eAG (mmol/L): 5.8 (calc)

## 2018-02-13 LAB — TSH: TSH: 1.7 mIU/L (ref 0.40–4.50)

## 2018-02-14 ENCOUNTER — Ambulatory Visit
Admission: RE | Admit: 2018-02-14 | Discharge: 2018-02-14 | Disposition: A | Discharge status: Home / Self Care | Payer: PRIVATE HEALTH INSURANCE | Source: Ambulatory Visit | Attending: Family Medicine | Admitting: Family Medicine

## 2018-02-14 ENCOUNTER — Ambulatory Visit: Payer: PRIVATE HEALTH INSURANCE | Admitting: Family Medicine

## 2018-02-14 ENCOUNTER — Other Ambulatory Visit
Admission: RE | Admit: 2018-02-14 | Discharge: 2018-02-14 | Disposition: A | Discharge status: Home / Self Care | Payer: PRIVATE HEALTH INSURANCE | Source: Ambulatory Visit | Attending: Family Medicine | Admitting: Family Medicine

## 2018-02-14 ENCOUNTER — Encounter: Payer: Self-pay | Admitting: Family Medicine

## 2018-02-14 VITALS — BP 120/68 | HR 92 | Temp 98.9°F | Resp 16 | Ht 71.0 in | Wt 196.1 lb

## 2018-02-14 DIAGNOSIS — R10823 Right lower quadrant rebound abdominal tenderness: Secondary | ICD-10-CM | POA: Diagnosis not present

## 2018-02-14 DIAGNOSIS — N201 Calculus of ureter: Secondary | ICD-10-CM

## 2018-02-14 DIAGNOSIS — R1031 Right lower quadrant pain: Secondary | ICD-10-CM | POA: Diagnosis not present

## 2018-02-14 DIAGNOSIS — M4317 Spondylolisthesis, lumbosacral region: Secondary | ICD-10-CM | POA: Diagnosis not present

## 2018-02-14 DIAGNOSIS — K429 Umbilical hernia without obstruction or gangrene: Secondary | ICD-10-CM | POA: Diagnosis not present

## 2018-02-14 LAB — CBC
HCT: 44.7 % (ref 40.0–52.0)
HEMOGLOBIN: 16 g/dL (ref 13.0–18.0)
MCH: 31 pg (ref 26.0–34.0)
MCHC: 35.8 g/dL (ref 32.0–36.0)
MCV: 86.7 fL (ref 80.0–100.0)
PLATELETS: 234 10*3/uL (ref 150–440)
RBC: 5.15 MIL/uL (ref 4.40–5.90)
RDW: 13.2 % (ref 11.5–14.5)
WBC: 9.3 10*3/uL (ref 3.8–10.6)

## 2018-02-14 LAB — COMPREHENSIVE METABOLIC PANEL
ALBUMIN: 4.3 g/dL (ref 3.5–5.0)
ALK PHOS: 92 U/L (ref 38–126)
ALT: 48 U/L — AB (ref 0–44)
ANION GAP: 11 (ref 5–15)
AST: 29 U/L (ref 15–41)
BUN: 15 mg/dL (ref 6–20)
CHLORIDE: 102 mmol/L (ref 98–111)
CO2: 26 mmol/L (ref 22–32)
CREATININE: 0.84 mg/dL (ref 0.61–1.24)
Calcium: 9.5 mg/dL (ref 8.9–10.3)
GFR calc Af Amer: >60 mL/min (ref >60)
GFR calc non Af Amer: >60 mL/min (ref >60)
Glucose, Bld: 98 mg/dL (ref 70–99)
Potassium: 3.7 mmol/L (ref 3.5–5.1)
Sodium: 139 mmol/L (ref 135–145)
Total Bilirubin: 0.9 mg/dL (ref 0.3–1.2)
Total Protein: 7.4 g/dL (ref 6.5–8.1)

## 2018-02-14 LAB — LIPASE, BLOOD: Lipase: 23 U/L (ref 11–51)

## 2018-02-14 MED ORDER — TAMSULOSIN HCL 0.4 MG PO CAPS: 0.4000 mg | ORAL_CAPSULE | Freq: Every day | ORAL | 0 refills | Status: DC

## 2018-02-14 MED ORDER — HYDROCODONE-ACETAMINOPHEN 10-325 MG PO TABS: 1.0000 | ORAL_TABLET | Freq: Four times a day (QID) | ORAL | 0 refills | Status: AC | PRN

## 2018-02-14 MED ORDER — IOPAMIDOL (ISOVUE-300) INJECTION 61%
100.0000 mL | Freq: Once | INTRAVENOUS | Status: AC | PRN
  Administered 2018-02-14: 100 mL via INTRAVENOUS

## 2018-02-14 NOTE — Progress Notes (Signed)
Name: Patrick Hull   MRN: [id number]    DOB: 11-12-1985   Date:02/14/2018       Progress Note  Subjective  Chief Complaint  Chief Complaint  Patient presents with  . Abdominal Pain    more severe RLQ, nausea for 2 days    HPI  Pt presents with concern for right-sided abdominal pain that started 3-4 days ago; upper abdominal pain x 3-4 days, then pain seemed to transition to lower quadrant.  Pain is constant, with sharp pains being intermittent; he applied heat yesterday and this did seem to help, leaning forward also seems to help.  He is very anxious today - worried because he is traveling to [place] in a few days.  Endorses nausea secondary to pain, soft stool but not watery, frequent BM's over the last few days.  Tried peptobismol this morning and can't tell much of a difference. Denies vomiting, blood in stool, or constipation, no changes in urinary frequency, no blood in stool, no dysuria.  Patient Active Problem List   Diagnosis Date Noted  . Tendinitis of right rotator cuff 01/21/2018  . Tear of right glenoid labrum 01/21/2018  . ADHD 07/26/2016  . Gastroesophageal reflux disease without esophagitis 07/10/2016    Social History   Tobacco Use  . Smoking status: Never Smoker  . Smokeless tobacco: Never Used  Substance Use Topics  . Alcohol use: No     Current Outpatient Medications:  .  atenolol (TENORMIN) 25 MG tablet, Take 0.5 tablets (12.5 mg total) by mouth at bedtime., Disp: 15 tablet, Rfl: 0 .  busPIRone (BUSPAR) 5 MG tablet, Take by mouth., Disp: , Rfl:  .  diphenhydrAMINE (BENADRYL) 25 MG tablet, Take 1-2 tablets (25-50 mg total) by mouth every 8 (eight) hours as needed for allergies., Disp: 15 tablet, Rfl: 0 .  EPINEPHrine 0.3 mg/0.3 mL IJ SOAJ injection, , Disp: , Rfl:  .  escitalopram (LEXAPRO) 10 MG tablet, Take 1 tablet (10 mg total) by mouth daily., Disp: 30 tablet, Rfl: 0 .  hydrOXYzine (ATARAX/VISTARIL) 10 MG tablet, Take 1 tablet (10 mg total) by mouth  3 (three) times daily as needed., Disp: 30 tablet, Rfl: 0  Allergies  Allergen Reactions  . Buspar [Buspirone]     headache  . Codeine Nausea Only    Other reaction(s): Unknown  . Sulfur     ROS  Ten systems reviewed and is negative except as mentioned in HPI  Objective  Vitals:   02/14/18 0743  BP: 120/68  Pulse: 92  Resp: 16  Temp: 98.9 F (37.2 C)  TempSrc: Oral  SpO2: 99%  Weight: 196 lb 1.6 oz (89 kg)  Height: 5\' 11"  (1.803 m)   Body mass index is 27.35 kg/m.  Nursing Note and Vital Signs reviewed.  Physical Exam  Constitutional: Patient appears well-developed and well-nourished. No distress.  HENT: Head: Normocephalic and atraumatic. Neck: Normal range of motion. Neck supple. No JVD present. No thyromegaly present.  Cardiovascular: Normal rate, regular rhythm and normal heart sounds.  No murmur heard. No BLE edema. Pulmonary/Chest: Effort normal and breath sounds normal. No respiratory distress. Abdominal: Soft. Bowel sounds are normal, no distension. There is tenderness to RUQ - negative murphy's sign; and rebound tenderness present to RLQ. No CVA tenderness, no masses. Musculoskeletal: Normal range of motion, no joint effusions. No gross deformities Neurological: he is alert and oriented to person, place, and time. No cranial nerve deficit. Coordination, balance, strength, speech and gait are normal.  Skin: Skin is warm and dry. No rash noted. No erythema.  Psychiatric: Patient has a normal mood and affect. behavior is normal. Judgment and thought content normal.   Results for orders placed or performed in visit on 02/11/18 (from the past 72 hour(s))  CBC with Differential/Platelet     Status: None   Collection Time: 02/11/18 11:21 AM  Result Value Ref Range   WBC 7.2 3.8 - 10.8 Thousand/uL   RBC 5.17 4.20 - 5.80 Million/uL   Hemoglobin 15.6 13.2 - 17.1 g/dL   HCT 11.944.8 14.738.5 - 82.950.0 %   MCV 86.7 80.0 - 100.0 fL   MCH 30.2 27.0 - 33.0 pg   MCHC 34.8 32.0  - 36.0 g/dL   RDW 56.212.5 13.011.0 - 86.515.0 %   Platelets 227 140 - 400 Thousand/uL   MPV 10.3 7.5 - 12.5 fL   Neutro Abs 3,823 1,500 - 7,800 cells/uL   Lymphs Abs 2,570 850 - 3,900 cells/uL   WBC mixed population 518 200 - 950 cells/uL   Eosinophils Absolute 230 15 - 500 cells/uL   Basophils Absolute 58 0 - 200 cells/uL   Neutrophils Relative % 53.1 %   Total Lymphocyte 35.7 %   Monocytes Relative 7.2 %   Eosinophils Relative 3.2 %   Basophils Relative 0.8 %  COMPLETE METABOLIC PANEL WITH GFR     Status: None   Collection Time: 02/11/18 11:21 AM  Result Value Ref Range   Glucose, Bld 83 65 - 139 mg/dL    Comment: .        Non-fasting reference interval .    BUN 19 7 - 25 mg/dL   Creat 7.840.90 6.960.60 - 2.951.35 mg/dL   GFR, Est Non African American 114 > OR = 60 mL/min/1.2973m2   GFR, Est African American 132 > OR = 60 mL/min/1.8873m2   BUN/Creatinine Ratio NOT APPLICABLE 6 - 22 (calc)   Sodium 139 135 - 146 mmol/L   Potassium 3.8 3.5 - 5.3 mmol/L   Chloride 104 98 - 110 mmol/L   CO2 25 20 - 32 mmol/L   Calcium 9.6 8.6 - 10.3 mg/dL   Total Protein 7.4 6.1 - 8.1 g/dL   Albumin 4.8 3.6 - 5.1 g/dL   Globulin 2.6 1.9 - 3.7 g/dL (calc)   AG Ratio 1.8 1.0 - 2.5 (calc)   Total Bilirubin 0.5 0.2 - 1.2 mg/dL   Alkaline phosphatase (APISO) 99 40 - 115 U/L   AST 28 10 - 40 U/L   ALT 36 9 - 46 U/L  Lipid panel     Status: Abnormal   Collection Time: 02/11/18 11:21 AM  Result Value Ref Range   Cholesterol 183 <200 mg/dL   HDL 49 >28>40 mg/dL   Triglycerides 99 <413<150 mg/dL   LDL Cholesterol (Calc) 113 (H) mg/dL (calc)    Comment: Reference range: <100 . Desirable range <100 mg/dL for primary prevention;   <70 mg/dL for patients with CHD or diabetic patients  with > or = 2 CHD risk factors. Marland Kitchen. LDL-C is now calculated using the Martin-Hopkins  calculation, which is a validated novel method providing  better accuracy than the Friedewald equation in the  estimation of LDL-C.  Horald PollenMartin SS et al. Lenox AhrJAMA.  2440;102(722013;310(19): 2061-2068  (http://education.[facility].com/faq/FAQ164)    Total CHOL/HDL Ratio 3.7 <5.0 (calc)   Non-HDL Cholesterol (Calc) 134 (H) <130 mg/dL (calc)    Comment: For patients with diabetes plus 1 major ASCVD risk  factor, treating to a non-HDL-C goal of <  100 mg/dL  (LDL-C of <13 mg/dL) is considered a therapeutic  option.   Hemoglobin A1c     Status: None   Collection Time: 02/11/18 11:21 AM  Result Value Ref Range   Hgb A1c MFr Bld 5.3 <5.7 % of total Hgb    Comment: For the purpose of screening for the presence of diabetes: . <5.7%       Consistent with the absence of diabetes 5.7-6.4%    Consistent with increased risk for diabetes             (prediabetes) > or =6.5%  Consistent with diabetes . This assay result is consistent with a decreased risk of diabetes. . Currently, no consensus exists regarding use of hemoglobin A1c for diagnosis of diabetes in children. . According to American Diabetes Association (ADA) guidelines, hemoglobin A1c <7.0% represents optimal control in non-pregnant diabetic patients. Different metrics may apply to specific patient populations.  Standards of Medical Care in Diabetes(ADA). .    Mean Plasma Glucose 105 (calc)   eAG (mmol/L) 5.8 (calc)  TSH     Status: None   Collection Time: 02/11/18 11:21 AM  Result Value Ref Range   TSH 1.70 0.40 - 4.50 mIU/L  TEST AUTHORIZATION     Status: None   Collection Time: 02/11/18 11:21 AM  Result Value Ref Range   TEST NAME: TSH    TEST CODE: [id number]    CLIENT CONTACT: CINDY CANTY    REPORT ALWAYS MESSAGE SIGNATURE      Comment: . The laboratory testing on this patient was verbally requested or confirmed by the ordering physician or his or her authorized representative after contact with an employee of [employer]. Federal regulations require that we maintain on file written authorization for all laboratory testing.  Accordingly we are asking that the ordering physician  or his or her authorized representative sign a copy of this report and promptly return it to the client service representative. . . Signature:____________________________________________________ . Please fax this signed page to [phone] or return it via your [employer] courier.     Assessment & Plan 1. Right lower quadrant abdominal tenderness with rebound tenderness - CBC - CT Abdomen Pelvis Wo Contrast; Future - Comprehensive Metabolic Panel (CMET) - CT Abdomen Pelvis W Contrast; Future  2. Right lower quadrant abdominal pain - CBC - CT Abdomen Pelvis Wo Contrast; Future - Comprehensive Metabolic Panel (CMET) - CT Abdomen Pelvis W Contrast; Future  -Red flags and when to present for emergency care or RTC including fever >101.15F, chest pain, shortness of breath, new/worsening/un-resolving symptoms, near-syncope reviewed with patient at time of visit. Follow up and care instructions discussed and provided in AVS.  ----------------- Addendum 11:30am  Pt back to office to discuss results - 2mm right ureteral stone - additional orders below: - HYDROcodone-acetaminophen (NORCO) 10-325 MG tablet; Take 1 tablet by mouth every 6 (six) hours as needed for up to 5 days for severe pain.  Dispense: 20 tablet; Refill: 0 - tamsulosin (FLOMAX) 0.4 MG CAPS capsule; Take 1 capsule (0.4 mg total) by mouth daily after supper.  Dispense: 5 capsule; Refill: 0 - Advised to strain urine to ensure passage - pt given strainer - Advised if not improving/worsening to present to ER/Urgent Care for further evaluation as we are entering a holiday weekend.

## 2018-02-14 NOTE — Patient Instructions (Signed)
[facility]; imaging is being scheduled.

## 2018-03-07 ENCOUNTER — Other Ambulatory Visit: Payer: Self-pay | Admitting: Family Medicine

## 2018-03-07 ENCOUNTER — Ambulatory Visit: Payer: PRIVATE HEALTH INSURANCE | Admitting: Family Medicine

## 2018-03-07 ENCOUNTER — Encounter: Payer: Self-pay | Admitting: Family Medicine

## 2018-03-07 VITALS — BP 102/70 | HR 75 | Temp 97.8°F | Resp 16 | Ht 71.0 in | Wt 199.5 lb

## 2018-03-07 DIAGNOSIS — Z87442 Personal history of urinary calculi: Secondary | ICD-10-CM | POA: Insufficient documentation

## 2018-03-07 DIAGNOSIS — N200 Calculus of kidney: Secondary | ICD-10-CM | POA: Insufficient documentation

## 2018-03-07 DIAGNOSIS — K429 Umbilical hernia without obstruction or gangrene: Secondary | ICD-10-CM | POA: Diagnosis not present

## 2018-03-07 DIAGNOSIS — F411 Generalized anxiety disorder: Secondary | ICD-10-CM

## 2018-03-07 DIAGNOSIS — F410 Panic disorder [episodic paroxysmal anxiety] without agoraphobia: Secondary | ICD-10-CM

## 2018-03-07 DIAGNOSIS — R002 Palpitations: Secondary | ICD-10-CM

## 2018-03-07 MED ORDER — ESCITALOPRAM OXALATE 10 MG PO TABS: 10.0000 mg | ORAL_TABLET | Freq: Every day | ORAL | 0 refills | Status: DC

## 2018-03-07 MED ORDER — HYDROXYZINE HCL 10 MG PO TABS: 10.0000 mg | ORAL_TABLET | Freq: Every day | ORAL | 0 refills | Status: DC | PRN

## 2018-03-07 NOTE — Progress Notes (Signed)
Name: Patrick Hull   MRN: [id number]    DOB: Sep 14, 1985   Date:03/07/2018       Progress Note  Subjective  Chief Complaint  Chief Complaint  Patient presents with  . Medication Management    He feels that Lexapro works for him about 75% of the time.    HPI  history of ADHD diagnosed by Erin Sons clinical psychologist back in 2012 when he started seeing her after the death of his grandfather. He states took a stimulant for a period of time and felt great. He was able to focus more, mind was not as busy but does not like taking medications and stopped on his own. Since Fall on 2018 he has not been feeling well. He states it all started after he had complications of vasectomy Fall 2018 from a large hematoma formation. After that had problems with his ear drum while on cruise. Followed by episodes of palpitation, SOB and hives that required two visit to [facility]. Since than seen by Cardiologist , Dr. Ubaldo Glassing and also Pulmonologist , Dr. Raul Del and finally Dr. Raul Del diagnosed him with anxiety and gave him Buspar. He took 3 days of medication but had to stop because of headache. He states mind always busy, difficulty relaxing, feels overwhelmed, difficulty falling and staying asleep. We started him on lexapro 10 mg August 2019 he is doing better, still feels anxious at random times, but no panic attack symptoms since. No side effects of medication. He never filled hydroxizine but we will re-send to pharmacy to take it prn.   Kidney stone: passed one at home 5 days ago, he has the stone at home, we will refer him to Urologist   CT abdomen: showed umbilical hernia, he states he noticed it about one year ago, discussed signs of infection he does not want surgery at this time, also DDD spine, gave him reassurance he does not have significant pain    Patient Active Problem List   Diagnosis Date Noted  . Nephrolithiasis 03/07/2018  . Umbilical hernia without obstruction and without gangrene 03/07/2018  .  Tendinitis of right rotator cuff 01/21/2018  . Tear of right glenoid labrum 01/21/2018  . ADHD 07/26/2016  . Gastroesophageal reflux disease without esophagitis 07/10/2016    Past Surgical History:  Procedure Laterality Date  . TONSILLECTOMY  2007    Family History  Problem Relation Age of Onset  . Hypertension Father   . Prostate cancer Neg Hx   . Bladder Cancer Neg Hx   . Kidney cancer Neg Hx     Social History   Socioeconomic History  . Marital status: Married    Spouse name: Miquel Dunn   . Number of children: 1  . Years of education: Not on file  . Highest education level: Not on file  Occupational History  . 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 Financial resource strain: Not on file  . Food insecurity:    Worry: Not on file    Inability: Not on file  . Transportation needs:    Medical: Not on file    Non-medical: Not on file  Tobacco Use  . Smoking status: Never Smoker  . Smokeless tobacco: Never Used  Substance and Sexual Activity  . Alcohol use: No  . Drug use: No  . Sexual activity: Yes    Partners: Female  Lifestyle  . Physical activity:    Days per week: 4  days    Minutes per session: 60 min  . Stress: Very much  Relationships  . Social connections:    Talks on phone: Not on file    Gets together: Not on file    Attends religious service: Not on file    Active member of club or organization: Not on file    Attends meetings of clubs or organizations: Not on file    Relationship status: Not on file  . Intimate partner violence:    Fear of current or ex partner: No    Emotionally abused: No    Physically abused: No    Forced sexual activity: No  Other Topics Concern  . 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      Current Outpatient Medications:  .  diphenhydrAMINE  (BENADRYL) 25 MG tablet, Take 1-2 tablets (25-50 mg total) by mouth every 8 (eight) hours as needed for allergies., Disp: 15 tablet, Rfl: 0 .  EPINEPHrine 0.3 mg/0.3 mL IJ SOAJ injection, , Disp: , Rfl:  .  escitalopram (LEXAPRO) 10 MG tablet, Take 1 tablet (10 mg total) by mouth daily., Disp: 90 tablet, Rfl: 0 .  hydrOXYzine (ATARAX/VISTARIL) 10 MG tablet, Take 1 tablet (10 mg total) by mouth daily as needed for anxiety., Disp: 30 tablet, Rfl: 0  Allergies  Allergen Reactions  . Buspar [Buspirone]     headache  . Codeine Nausea Only    Other reaction(s): Unknown  . Sulfur     I personally reviewed active problem list, medication list, allergies, family history, social history with the patient/caregiver today.   ROS  Constitutional: Negative for fever or weight change.  Respiratory: Negative for cough and shortness of breath.   Cardiovascular: Negative for chest pain or palpitations.  Gastrointestinal: Negative for abdominal pain, no bowel changes.  Musculoskeletal: Negative for gait problem or joint swelling.  Skin: Negative for rash.  Neurological: Negative for dizziness or headache.  No other specific complaints in a complete review of systems (except as listed in HPI above).   Objective  Vitals:   03/07/18 1043  BP: 102/70  Pulse: 75  Resp: 16  Temp: 97.8 F (36.6 C)  TempSrc: Oral  SpO2: 98%  Weight: 199 lb 8 oz (90.5 kg)  Height: 5' 11"  (1.803 m)    Body mass index is 27.82 kg/m.  Physical Exam  Constitutional: Patient appears well-developed and well-nourished.  No distress.  HEENT: head atraumatic, normocephalic, pupils equal and reactive to light, neck supple, throat within normal limits Cardiovascular: Normal rate, regular rhythm and normal heart sounds.  No murmur heard. No BLE edema. Pulmonary/Chest: Effort normal and breath sounds normal. No respiratory distress. Abdominal: Soft.  There is no tenderness. Psychiatric: Patient has a normal mood and  affect. behavior is normal. Judgment and thought content normal.  Recent Results (from the past 2160 hour(s))  CBC with Differential/Platelet     Status: None   Collection Time: 02/11/18 11:21 AM  Result Value Ref Range   WBC 7.2 3.8 - 10.8 Thousand/uL   RBC 5.17 4.20 - 5.80 Million/uL   Hemoglobin 15.6 13.2 - 17.1 g/dL   HCT 44.8 38.5 - 50.0 %   MCV 86.7 80.0 - 100.0 fL   MCH 30.2 27.0 - 33.0 pg   MCHC 34.8 32.0 - 36.0 g/dL   RDW  12.5 11.0 - 15.0 %   Platelets 227 140 - 400 Thousand/uL   MPV 10.3 7.5 - 12.5 fL   Neutro Abs 3,823 1,500 - 7,800 cells/uL   Lymphs Abs 2,570 850 - 3,900 cells/uL   WBC mixed population 518 200 - 950 cells/uL   Eosinophils Absolute 230 15 - 500 cells/uL   Basophils Absolute 58 0 - 200 cells/uL   Neutrophils Relative % 53.1 %   Total Lymphocyte 35.7 %   Monocytes Relative 7.2 %   Eosinophils Relative 3.2 %   Basophils Relative 0.8 %  COMPLETE METABOLIC PANEL WITH GFR     Status: None   Collection Time: 02/11/18 11:21 AM  Result Value Ref Range   Glucose, Bld 83 65 - 139 mg/dL    Comment: .        Non-fasting reference interval .    BUN 19 7 - 25 mg/dL   Creat 0.90 0.60 - 1.35 mg/dL   GFR, Est Non African American 114 > OR = 60 mL/min/1.28m   GFR, Est African American 132 > OR = 60 mL/min/1.773m  BUN/Creatinine Ratio NOT APPLICABLE 6 - 22 (calc)   Sodium 139 135 - 146 mmol/L   Potassium 3.8 3.5 - 5.3 mmol/L   Chloride 104 98 - 110 mmol/L   CO2 25 20 - 32 mmol/L   Calcium 9.6 8.6 - 10.3 mg/dL   Total Protein 7.4 6.1 - 8.1 g/dL   Albumin 4.8 3.6 - 5.1 g/dL   Globulin 2.6 1.9 - 3.7 g/dL (calc)   AG Ratio 1.8 1.0 - 2.5 (calc)   Total Bilirubin 0.5 0.2 - 1.2 mg/dL   Alkaline phosphatase (APISO) 99 40 - 115 U/L   AST 28 10 - 40 U/L   ALT 36 9 - 46 U/L  Lipid panel     Status: Abnormal   Collection Time: 02/11/18 11:21 AM  Result Value Ref Range   Cholesterol 183 <200 mg/dL   HDL 49 >40 mg/dL   Triglycerides 99 <150 mg/dL   LDL  Cholesterol (Calc) 113 (H) mg/dL (calc)    Comment: Reference range: <100 . Desirable range <100 mg/dL for primary prevention;   <70 mg/dL for patients with CHD or diabetic patients  with > or = 2 CHD risk factors. . Marland KitchenDL-C is now calculated using the Martin-Hopkins  calculation, which is a validated novel method providing  better accuracy than the Friedewald equation in the  estimation of LDL-C.  MaCresenciano Genret al. JAAnnamaria [id number]  (http://education.[facility].com/faq/FAQ164)    Total CHOL/HDL Ratio 3.7 <5.0 (calc)   Non-HDL Cholesterol (Calc) 134 (H) <130 mg/dL (calc)    Comment: For patients with diabetes plus 1 major ASCVD risk  factor, treating to a non-HDL-C goal of <100 mg/dL  (LDL-C of <70 mg/dL) is considered a therapeutic  option.   Hemoglobin A1c     Status: None   Collection Time: 02/11/18 11:21 AM  Result Value Ref Range   Hgb A1c MFr Bld 5.3 <5.7 % of total Hgb    Comment: For the purpose of screening for the presence of diabetes: . <5.7%       Consistent with the absence of diabetes 5.7-6.4%    Consistent with increased risk for diabetes             (prediabetes) > or =6.5%  Consistent with diabetes . This assay result is consistent with a decreased risk of diabetes. . Currently, no consensus exists regarding use of hemoglobin A1c  for diagnosis of diabetes in children. . According to American Diabetes Association (ADA) guidelines, hemoglobin A1c <7.0% represents optimal control in non-pregnant diabetic patients. Different metrics may apply to specific patient populations.  Standards of Medical Care in Diabetes(ADA). .    Mean Plasma Glucose 105 (calc)   eAG (mmol/L) 5.8 (calc)  TSH     Status: None   Collection Time: 02/11/18 11:21 AM  Result Value Ref Range   TSH 1.70 0.40 - 4.50 mIU/L  TEST AUTHORIZATION     Status: None   Collection Time: 02/11/18 11:21 AM  Result Value Ref Range   TEST NAME: TSH    TEST CODE: [id number]     CLIENT CONTACT: CINDY CANTY    REPORT ALWAYS MESSAGE SIGNATURE      Comment: . The laboratory testing on this patient was verbally requested or confirmed by the ordering physician or his or her authorized representative after contact with an employee of [employer]. Federal regulations require that we maintain on file written authorization for all laboratory testing.  Accordingly we are asking that the ordering physician or his or her authorized representative sign a copy of this report and promptly return it to the client service representative. . . Signature:____________________________________________________ . Please fax this signed page to [phone] or return it via your [employer] courier.   CBC     Status: None   Collection Time: 02/14/18 11:46 AM  Result Value Ref Range   WBC 9.3 3.8 - 10.6 K/uL   RBC 5.15 4.40 - 5.90 MIL/uL   Hemoglobin 16.0 13.0 - 18.0 g/dL    Comment: RESULT REPEATED AND VERIFIED   HCT 44.7 40.0 - 52.0 %   MCV 86.7 80.0 - 100.0 fL   MCH 31.0 26.0 - 34.0 pg   MCHC 35.8 32.0 - 36.0 g/dL   RDW 13.2 11.5 - 14.5 %   Platelets 234 150 - 440 K/uL    Comment: Performed at [facility], [address] [place], [place] [address]  Comprehensive metabolic panel     Status: Abnormal   Collection Time: 02/14/18 11:46 AM  Result Value Ref Range   Sodium 139 135 - 145 mmol/L   Potassium 3.7 3.5 - 5.1 mmol/L   Chloride 102 98 - 111 mmol/L   CO2 26 22 - 32 mmol/L   Glucose, Bld 98 70 - 99 mg/dL   BUN 15 6 - 20 mg/dL   Creatinine, Ser 0.84 0.61 - 1.24 mg/dL   Calcium 9.5 8.9 - 10.3 mg/dL   Total Protein 7.4 6.5 - 8.1 g/dL   Albumin 4.3 3.5 - 5.0 g/dL   AST 29 15 - 41 U/L   ALT 48 (H) 0 - 44 U/L   Alkaline Phosphatase 92 38 - 126 U/L   Total Bilirubin 0.9 0.3 - 1.2 mg/dL   GFR calc non Af Amer >60 >60 mL/min   GFR calc Af Amer >60 >60 mL/min    Comment: (NOTE) The eGFR has been calculated using the CKD EPI equation. This  calculation has not been validated in all clinical situations. eGFR's persistently <60 mL/min signify possible Chronic Kidney Disease.    Anion gap 11 5 - 15    Comment: Performed at [facility], [address] [place], [place] [address]  Lipase, blood     Status: None   Collection Time: 02/14/18 11:46 AM  Result Value Ref Range   Lipase 23 11 - 51 U/L    Comment: Performed at [facility], [place],  [address]      PHQ2/9: Depression screen [facility] 2/9 02/14/2018 02/11/2018 07/26/2016  Decreased Interest 0 0 0  Down, Depressed, Hopeless 1 1 0  PHQ - 2 Score 1 1 0  Altered sleeping 1 2 -  Tired, decreased energy 0 1 -  Change in appetite 0 0 -  Feeling bad or failure about yourself  0 1 -  Trouble concentrating 0 0 -  Moving slowly or fidgety/restless 0 0 -  Suicidal thoughts 0 0 -  PHQ-9 Score 2 5 -  Difficult doing work/chores Somewhat difficult Not difficult at all -     Fall Risk: Fall Risk  03/07/2018 03/07/2018 02/14/2018 02/11/2018 07/26/2016  Falls in the past year? No No No No No     Assessment & Plan  1. Nephrolithiasis  - Ambulatory referral to Urology, advised to take stone with him   2. Umbilical hernia without obstruction and without gangrene  Discussed signs of infection   3. GAD (generalized anxiety disorder)  - escitalopram (LEXAPRO) 10 MG tablet; Take 1 tablet (10 mg total) by mouth daily.  Dispense: 90 tablet; Refill: 0  4. Panic attack  Doing well at this time

## 2018-03-13 ENCOUNTER — Encounter: Payer: Self-pay | Admitting: Urology

## 2018-03-13 ENCOUNTER — Ambulatory Visit: Payer: PRIVATE HEALTH INSURANCE | Admitting: Urology

## 2018-03-26 ENCOUNTER — Encounter: Payer: Self-pay | Admitting: Urology

## 2018-03-26 ENCOUNTER — Ambulatory Visit: Payer: PRIVATE HEALTH INSURANCE | Admitting: Urology

## 2018-03-26 VITALS — BP 136/84 | HR 68 | Ht 71.0 in | Wt 203.0 lb

## 2018-03-26 DIAGNOSIS — Z87442 Personal history of urinary calculi: Secondary | ICD-10-CM

## 2018-03-26 LAB — URINALYSIS, COMPLETE
Bilirubin, UA: NEGATIVE
Glucose, UA: NEGATIVE
Ketones, UA: NEGATIVE
Leukocytes, UA: NEGATIVE
NITRITE UA: NEGATIVE
PH UA: 7.5 (ref 5.0–7.5)
Protein, UA: NEGATIVE
RBC UA: NEGATIVE
SPEC GRAV UA: 1.02 (ref 1.005–1.030)
Urobilinogen, Ur: 0.2 mg/dL (ref 0.2–1.0)

## 2018-03-26 NOTE — Progress Notes (Signed)
03/26/2018 10:26 AM   Patrick Hull 10-03-1985 [id number]  Referring provider: Alba Cory, MD [address] [place], [place] [address]  Chief Complaint  Patient presents with  . Nephrolithiasis    HPI: 32 year old male who presents today after passing a small 2 mm right kidney stone.   Patrick Hull was seen and evaluated by his primary care physician, Dr. Carlynn Hull on 02/14/2018 with several days of initially right upper quadrant pain and ultimately right lower quadrant pain.  He ultimately underwent CT abdomen with contrast which showed a 2 mm right proximal ureteral calculus with proximal hydronephrosis.  He subsequently passed the stone but with significant discomfort.  He brings a stone with him today.  His pain is completely resolved at this point.  He has no personal history of kidney stones.  His mom did pass several kidney stones as a young adult but has not had any since.  He does work outside in the garage.  He sweats a lot but does drink a copious amount of water.  He denies a high salt or high animal protein diet.  He is worried that sugar caused kidney stones so stop drinking sugar red bull and transition to sugar-free.  When he drinks soda, he primarily drinks Sprite.  Denies any urinary issues including dysuria or gross hematuria.   PMH: Past Medical History:  Diagnosis Date  . ADHD   . Muscle spasm   . Personal history of kidney stones   . Tendinitis of right wrist   . Upper respiratory disease     Surgical History: Past Surgical History:  Procedure Laterality Date  . TONSILLECTOMY  2007  . VASECTOMY      Home Medications:  Allergies as of 03/26/2018      Reactions   Buspar [buspirone]    headache   Codeine Nausea Only   Other reaction(s): Unknown   Sulfur       Medication List        Accurate as of 03/26/18 10:26 AM. Always use your most recent med list.          diphenhydrAMINE 25 MG tablet Commonly known as:  BENADRYL Take  1-2 tablets (25-50 mg total) by mouth every 8 (eight) hours as needed for allergies.   EPINEPHrine 0.3 mg/0.3 mL Soaj injection Commonly known as:  EPI-PEN   escitalopram 10 MG tablet Commonly known as:  LEXAPRO Take 1 tablet (10 mg total) by mouth daily.   hydrOXYzine 10 MG tablet Commonly known as:  ATARAX/VISTARIL Take 1 tablet (10 mg total) by mouth daily as needed for anxiety.       Allergies:  Allergies  Allergen Reactions  . Buspar [Buspirone]     headache  . Codeine Nausea Only    Other reaction(s): Unknown  . Sulfur     Family History: Family History  Problem Relation Age of Onset  . Hypertension Father   . Prostate cancer Neg Hx   . Bladder Cancer Neg Hx   . Kidney cancer Neg Hx     Social History:  reports that he has never smoked. He has never used smokeless tobacco. He reports that he does not drink alcohol or use drugs.  ROS: UROLOGY Frequent Urination?: No Hard to postpone urination?: No Burning/pain with urination?: No Get up at night to urinate?: No Leakage of urine?: No Urine stream starts and stops?: No Trouble starting stream?: No Do you have to strain to urinate?: No Blood in urine?: No Urinary  tract infection?: No Sexually transmitted disease?: No Injury to kidneys or bladder?: No Painful intercourse?: No Weak stream?: No Erection problems?: No Penile pain?: No  Gastrointestinal Nausea?: No Vomiting?: No Indigestion/heartburn?: No Diarrhea?: No Constipation?: No  Constitutional Fever: No Night sweats?: No Weight loss?: No Fatigue?: No  Skin Skin rash/lesions?: No Itching?: No  Eyes Blurred vision?: No Double vision?: No  Ears/Nose/Throat Sore throat?: No Sinus problems?: No  Hematologic/Lymphatic Swollen glands?: No Easy bruising?: No  Cardiovascular Leg swelling?: No Chest pain?: No  Respiratory Cough?: No Shortness of breath?: No  Endocrine Excessive thirst?: No  Musculoskeletal Back pain?:  No Joint pain?: No  Neurological Headaches?: No Dizziness?: No  Psychologic Depression?: No Anxiety?: No  Physical Exam: BP 136/84 (BP Location: Left Arm, Patient Position: Sitting, Cuff Size: Large)   Pulse 68   Ht 5\' 11"  (1.803 m)   Wt 203 lb (92.1 kg)   BMI 28.31 kg/m   Constitutional:  Alert and oriented, No acute distress. HEENT: [place] AT, moist mucus membranes.  Trachea midline, no masses. Cardiovascular: No clubbing, cyanosis, or edema. Respiratory: Normal respiratory effort, no increased work of breathing. Skin: No rashes, bruises or suspicious lesions. Neurologic: Grossly intact, no focal deficits, moving all 4 extremities. Psychiatric: Normal mood and affect.  Laboratory Data: Lab Results  Component Value Date   WBC 9.3 02/14/2018   HGB 16.0 02/14/2018   HCT 44.7 02/14/2018   MCV 86.7 02/14/2018   PLT 234 02/14/2018    Lab Results  Component Value Date   CREATININE 0.84 02/14/2018    Lab Results  Component Value Date   HGBA1C 5.3 02/11/2018    Urinalysis UA reviewed, no evidence of microscopic blood or infection, see epic  Pertinent Imaging: CLINICAL DATA:  Nausea, vomiting, RIGHT lower quadrant pain for 3 days, increased number of bowel movements, RIGHT lower quadrant abdominal tenderness with rebound, suspected appendicitis  EXAM: CT ABDOMEN AND PELVIS WITH CONTRAST  TECHNIQUE: Multidetector CT imaging of the abdomen and pelvis was performed using the standard protocol following bolus administration of intravenous contrast. Sagittal and coronal MPR images reconstructed from axial data set.  CONTRAST:  ISOVUE-300 IOPAMIDOL (ISOVUE-300) INJECTION 61% IV. Dilute oral contrast.  COMPARISON:  None  FINDINGS: Lower chest: Lung bases clear  Hepatobiliary: Gallbladder and liver normal appearance  Pancreas: Normal appearance  Spleen: Normal appearance  Adrenals/Urinary Tract: Adrenal glands normal appearance.  Symmetric nephrograms without renal mass. RIGHT renal collecting system is minimally prominent in caliber versus LEFT. Additionally, a 2 mm proximal RIGHT ureteral calculus is identified image 37. No additional urinary tract calcifications or LEFT ureteral dilatation. Bladder unremarkable.  Stomach/Bowel: Appendix not definitely visualized but no pericecal inflammatory process seen. Stomach and bowel loops normal appearance  Vascular/Lymphatic: Vascular structures patent. Scattered normal size mesenteric lymph nodes. No abdominal or pelvic adenopathy  Reproductive: Unremarkable  Other: Small umbilical hernia containing fat. No free air or free fluid.  Musculoskeletal: Grade 1 spondylolisthesis L5-S1 secondary to BILATERAL spondylolysis L5. Associated pseudo disc.  IMPRESSION: 2 mm proximal RIGHT ureteral calculus with minimal RIGHT renal collecting system dilatation.  Small umbilical hernia containing fat.  Grade 1 spondylolisthesis L5-S1 secondary to BILATERAL spondylolysis L5.   Electronically Signed   By: Ulyses Southward M.D.   On: 02/14/2018 11:10  CT scan personally reviewed today.  Agree with interpretation.  No other kidney stones appreciated.  Assessment & Plan:    1. History of kidney stones Interval passage of 2 mm kidney stone, no further stones in  upper tract on CT scan Stone sent for stone analysis today at patient request Given that this is his first and only stone, no indication for further follow-up or metabolic evaluation We discussed general stone prevention techniques including drinking plenty water with goal of producing 2.5 L urine daily, increased citric acid intake, avoidance of high oxalate containing foods, and decreased salt intake.  Information about dietary recommendations given today. - Urinalysis, Complete  Follow-up as needed, will call him with his stone urinalysis results  Vanna Scotland, MD  [facility] [address], [address] [place], [place] [address] [phone]

## 2018-04-01 ENCOUNTER — Other Ambulatory Visit: Payer: Self-pay | Admitting: Urology

## 2018-05-07 ENCOUNTER — Other Ambulatory Visit: Payer: Self-pay | Admitting: Family Medicine

## 2018-05-07 DIAGNOSIS — F410 Panic disorder [episodic paroxysmal anxiety] without agoraphobia: Secondary | ICD-10-CM

## 2018-05-07 DIAGNOSIS — F411 Generalized anxiety disorder: Secondary | ICD-10-CM

## 2018-05-23 ENCOUNTER — Telehealth: Payer: Self-pay | Admitting: Emergency Medicine

## 2018-05-23 NOTE — Telephone Encounter (Signed)
patient mother notified

## 2018-05-23 NOTE — Telephone Encounter (Signed)
Patient mother called Zella BallRobin and stated Patrick Hull has been throwing up with diarrhea all night. Is there anything over the counter you can suggest for this.

## 2018-05-23 NOTE — Telephone Encounter (Signed)
If he is feeling that badly and has not been improving, he needs to go to urgent care or the ER to be seen.  He has a history of kidney stones, and without examining him myself, I cannot make a recommendation.

## 2018-06-06 ENCOUNTER — Ambulatory Visit: Payer: PRIVATE HEALTH INSURANCE | Admitting: Family Medicine

## 2018-06-06 ENCOUNTER — Encounter: Payer: Self-pay | Admitting: Family Medicine

## 2018-06-06 VITALS — BP 100/68 | HR 88 | Temp 98.1°F | Resp 16 | Ht 71.0 in | Wt 215.6 lb

## 2018-06-06 DIAGNOSIS — F902 Attention-deficit hyperactivity disorder, combined type: Secondary | ICD-10-CM | POA: Diagnosis not present

## 2018-06-06 DIAGNOSIS — Z23 Encounter for immunization: Secondary | ICD-10-CM | POA: Diagnosis not present

## 2018-06-06 DIAGNOSIS — E6609 Other obesity due to excess calories: Secondary | ICD-10-CM

## 2018-06-06 DIAGNOSIS — F410 Panic disorder [episodic paroxysmal anxiety] without agoraphobia: Secondary | ICD-10-CM

## 2018-06-06 DIAGNOSIS — F411 Generalized anxiety disorder: Secondary | ICD-10-CM

## 2018-06-06 DIAGNOSIS — Z6830 Body mass index (BMI) 30.0-30.9, adult: Secondary | ICD-10-CM

## 2018-06-06 DIAGNOSIS — N200 Calculus of kidney: Secondary | ICD-10-CM | POA: Diagnosis not present

## 2018-06-06 DIAGNOSIS — K219 Gastro-esophageal reflux disease without esophagitis: Secondary | ICD-10-CM

## 2018-06-06 MED ORDER — ESCITALOPRAM OXALATE 10 MG PO TABS: ORAL_TABLET | ORAL | 0 refills | Status: DC

## 2018-06-06 MED ORDER — HYDROXYZINE HCL 10 MG PO TABS: 10.0000 mg | ORAL_TABLET | Freq: Every day | ORAL | 0 refills | Status: DC | PRN

## 2018-06-06 NOTE — Progress Notes (Signed)
Name: Patrick BradfordCasey C Hull   MRN: [id number]    DOB: 09/16/1985   Date:06/06/2018       Progress Note  Subjective  Chief Complaint  Chief Complaint  Patient presents with  . Follow-up    HPI  History of ADHD diagnosed by Dierdre HighmanLauran Taber clinical psychologist back in 2012 when he started seeing her after the death of his grandfather. He states took a stimulant for a period of time and felt great. He was able to focus more, mind was not as busy but does not like taking medications and stopped on his own. Since Fall on 2018 he has not been feeling well. He states it all started after he had complications of vasectomy Fall 2018 from a large hematoma formation. After that had problems with his ear drum while on cruise. Followed by episodes of palpitation, SOB and hives that required [facility] to [facility]. Since than seen by Cardiologist , Dr. Lady GaryFath and also Pulmonologist , Dr. Meredeth IdeFleming and finally Dr. Meredeth IdeFleming diagnosed him with anxiety and gave him Buspar. He took 3 days of medication but had to stop because of headache. He states mind always busy, difficulty relaxing, feels overwhelmed, difficulty falling and staying asleep. Now doing well on Lexapro daily, taking Hydroxyzine seldomly. Has had maybe 3 panic attacks in the last month - feels like medication works 99.5% of the time.  He is feeling really good right now - Holidays are not stressful for him.  He is not going to counseling.   Kidney stone: Has history; no further issues with this; saw Urology and was told it was calcium oxalate and calcium phosphate.   CT abdomen: showed umbilical hernia, he states he noticed it about one year ago, discussed signs of infection he does not want surgery at this time, also DDD spine, gave him reassurance he does not have significant pain.  Stable and unchanged.   Obesity: He is going to the gym for 1-1.5 hours four days a week. Eats what he wants when he is hungry. Avoids snacks and eating out - eats about 4 meals a  day.  Allergic Reaction: He has a history of breaking out in hives - had presented to the ER for this, and the rash resolved; the second time he broke out in hives he had hives all over his face.  Never figured out why he had this reaction, so he keeps an Epipen on hand - does know how to use it.   GERD: Avoiding triggers, sleeps on wedge pillow and stops eating early in the evening. Not on medication daily - used to take Zantac but stopped due to recall; now taking Prilosec OTC PRN. No difficulty swallowing or abdominal pain.   Patient Active Problem List   Diagnosis Date Noted  . Nephrolithiasis 03/07/2018  . Umbilical hernia without obstruction and without gangrene 03/07/2018  . Tendinitis of right rotator cuff 01/21/2018  . Tear of right glenoid labrum 01/21/2018  . ADHD 07/26/2016  . Gastroesophageal reflux disease without esophagitis 07/10/2016    Past Surgical History:  Procedure Laterality Date  . TONSILLECTOMY  2007  . VASECTOMY      Family History  Problem Relation Age of Onset  . Hypertension Father   . Prostate cancer Neg Hx   . Bladder Cancer Neg Hx   . Kidney cancer Neg Hx     Social History   Socioeconomic History  . Marital status: Married    Spouse name: Phineas Semenshton   . Number  of children: 1  . Years of education: Not on file  . Highest education level: Not on file  Occupational History  . 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 Financial resource strain: Not on file  . Food insecurity:    Worry: Not on file    Inability: Not on file  . Transportation needs:    Medical: Not on file    Non-medical: Not on file  Tobacco Use  . Smoking status: Never Smoker  . Smokeless tobacco: Never Used  Substance and Sexual Activity  . Alcohol use: No  . Drug use: No  . Sexual activity: Yes    Partners: Female    Birth control/protection: Surgical    Comment: Vasectomy  Lifestyle  . Physical activity:    Days per  week: 4 days    Minutes per session: 60 min  . Stress: Very much  Relationships  . Social connections:    Talks on phone: Not on file    Gets together: Not on file    Attends religious service: Not on file    Active member of club or organization: Not on file    Attends meetings of clubs or organizations: Not on file    Relationship status: Not on file  . Intimate partner violence:    Fear of current or ex partner: No    Emotionally abused: No    Physically abused: No    Forced sexual activity: No  Other Topics Concern  . 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      Current Outpatient Medications:  .  EPINEPHrine 0.3 mg/0.3 mL IJ SOAJ injection, , Disp: , Rfl:  .  escitalopram (LEXAPRO) 10 MG tablet, TAKE 1 TABLET(10 MG) BY MOUTH DAILY, Disp: 90 tablet, Rfl: 0 .  hydrOXYzine (ATARAX/VISTARIL) 10 MG tablet, Take 1 tablet (10 mg total) by mouth daily as needed for anxiety., Disp: 30 tablet, Rfl: 0 .  diphenhydrAMINE (BENADRYL) 25 MG tablet, Take 1-2 tablets (25-50 mg total) by mouth every 8 (eight) hours as needed for allergies. (Patient not taking: Reported on 06/06/2018), Disp: 15 tablet, Rfl: 0  Allergies  Allergen Reactions  . Buspar [Buspirone]     headache  . Codeine Nausea Only    Other reaction(s): Unknown  . Sulfur     I personally reviewed active problem list, medication list, allergies, health maintenance, notes from last encounter, lab results with the patient/caregiver today.   ROS Constitutional: Negative for fever or weight change.  Respiratory: Negative for cough and shortness of breath.   Cardiovascular: Negative for chest pain or palpitations.  Gastrointestinal: Negative for abdominal pain, no bowel changes.  Musculoskeletal: Negative for gait problem or joint swelling.  Skin: Negative for rash.  Neurological: Negative for  dizziness or headache.  No other specific complaints in a complete review of systems (except as listed in HPI above).  Objective  Vitals:   06/06/18 0752  BP: 100/68  Pulse: 88  Resp: 16  Temp: 98.1 F (36.7 C)  TempSrc: Oral  SpO2: 99%  Weight: 215 lb 9.6 oz (97.8 kg)  Height: 5\' 11"  (1.803 m)    Body mass index is 30.07 kg/m.  Physical Exam Constitutional: Patient appears well-developed and well-nourished. No distress.  HENT:  Head: Normocephalic and atraumatic. Eyes: Conjunctivae and EOM are normal. No scleral icterus. Neck: Normal range of motion. Neck supple. No JVD present. No thyromegaly present.  Cardiovascular: Normal rate, regular rhythm and normal heart sounds.  No murmur heard. No BLE edema. Pulmonary/Chest: Effort normal and breath sounds normal. No respiratory distress. Abdominal: Soft. Bowel sounds are normal, no distension. There is a very tiny 12:00 umbilical hernia that is mildly tender but reducible and supple, flesh colored. Musculoskeletal: Normal range of motion, no joint effusions. No gross deformities Neurological: Pt is alert and oriented to person, place, and time. No cranial nerve deficit. Coordination, balance, strength, speech and gait are normal.  Skin: Skin is warm and dry. No rash noted. No erythema.  Psychiatric: Patient has a normal mood and affect. behavior is normal. Judgment and thought content normal.  No results found for this or any previous visit (from the past 72 hour(s)).  PHQ2/9: Depression screen [facility] 2/9 06/06/2018 02/14/2018 02/11/2018 07/26/2016  Decreased Interest 0 0 0 0  Down, Depressed, Hopeless 0 1 1 0  PHQ - 2 Score 0 1 1 0  Altered sleeping 0 1 2 -  Tired, decreased energy 0 0 1 -  Change in appetite 0 0 0 -  Feeling bad or failure about yourself  0 0 1 -  Trouble concentrating 0 0 0 -  Moving slowly or fidgety/restless 0 0 0 -  Suicidal thoughts 0 0 0 -  PHQ-9 Score 0 2 5 -  Difficult doing work/chores Not difficult at  all Somewhat difficult Not difficult at all -   Fall Risk: Fall Risk  06/06/2018 03/07/2018 03/07/2018 02/14/2018 02/11/2018  Falls in the past year? 0 No No No No  Number falls in past yr: 0 - - - -  Injury with Fall? 0 - - - -   Assessment & Plan  1. GAD (generalized anxiety disorder) - Stable, doing well, continue medications - escitalopram (LEXAPRO) 10 MG tablet; TAKE 1 TABLET(10 MG) BY MOUTH DAILY  Dispense: 90 tablet; Refill: 0  2. Panic attack - Stable, doing well, continue medications - escitalopram (LEXAPRO) 10 MG tablet; TAKE 1 TABLET(10 MG) BY MOUTH DAILY  Dispense: 90 tablet; Refill: 0 - hydrOXYzine (ATARAX/VISTARIL) 10 MG tablet; Take 1 tablet (10 mg total) by mouth daily as needed for anxiety.  Dispense: 30 tablet; Refill: 0  3. Need for influenza vaccination - Flu Vaccine QUAD 6+ mos PF IM (Fluarix Quad PF)  4. Nephrolithiasis - No new issues; stable  5. Attention deficit hyperactivity disorder (ADHD), combined type - Stable  6. Class 1 obesity due to excess calories without serious comorbidity with body mass index (BMI) of 30.0 to 30.9 in adult - Discussed importance of 150 minutes of physical activity weekly, eat two servings of fish weekly, eat one serving of tree nuts ( cashews, pistachios, pecans, almonds.Marland Kitchen.) every other day, eat 6 servings of fruit/vegetables daily and drink plenty of water and avoid sweet beverages.   7. Gastroesophageal reflux disease without esophagitis - Stable on PRN Prilosec and avoiding triggers.

## 2018-08-08 ENCOUNTER — Other Ambulatory Visit: Payer: Self-pay

## 2018-08-08 ENCOUNTER — Telehealth: Payer: Self-pay | Admitting: Family Medicine

## 2018-08-08 ENCOUNTER — Ambulatory Visit: Payer: PRIVATE HEALTH INSURANCE | Admitting: Family Medicine

## 2018-08-08 ENCOUNTER — Encounter: Payer: Self-pay | Admitting: Family Medicine

## 2018-08-08 VITALS — BP 122/70 | HR 87 | Temp 98.0°F | Resp 16 | Ht 71.0 in | Wt 218.6 lb

## 2018-08-08 DIAGNOSIS — R591 Generalized enlarged lymph nodes: Secondary | ICD-10-CM

## 2018-08-08 DIAGNOSIS — J029 Acute pharyngitis, unspecified: Secondary | ICD-10-CM | POA: Diagnosis not present

## 2018-08-08 DIAGNOSIS — R0981 Nasal congestion: Secondary | ICD-10-CM | POA: Diagnosis not present

## 2018-08-08 LAB — POCT RAPID STREP A (OFFICE): RAPID STREP A SCREEN: NEGATIVE

## 2018-08-08 MED ORDER — PREDNISONE 20 MG PO TABS: 20.0000 mg | ORAL_TABLET | Freq: Two times a day (BID) | ORAL | 0 refills | Status: AC

## 2018-08-08 MED ORDER — AZITHROMYCIN 250 MG PO TABS: ORAL_TABLET | ORAL | 0 refills | Status: DC

## 2018-08-08 MED ORDER — FLUTICASONE PROPIONATE 50 MCG/ACT NA SUSP: 2.0000 | Freq: Every day | NASAL | 6 refills | Status: DC

## 2018-08-08 MED ORDER — MAGIC MOUTHWASH W/LIDOCAINE: 5.0000 mL | Freq: Four times a day (QID) | ORAL | 0 refills | Status: DC | PRN

## 2018-08-08 MED ORDER — SALINE SPRAY 0.65 % NA SOLN: 1.0000 | NASAL | 0 refills | Status: DC | PRN

## 2018-08-08 NOTE — Telephone Encounter (Signed)
This was faxed by Myriam Jacobson this afternoon. Please call the pharmacy to confirm receipt.  Magic Mouthwash: 1/4 Lidocaine, 1/4 benadryl, 1/4 hyrocortisone or nystatin (whichever is in stock - prefer hydrocortisone), and 1/4 maalox. Quantity: , Refills: 0 Instructions: Gargle and spit 74ml four times daily as needed for throat pain.

## 2018-08-08 NOTE — Telephone Encounter (Signed)
Routed back to [facility] not refilling for the practice

## 2018-08-08 NOTE — Progress Notes (Signed)
Name: Patrick Hull   MRN: [id number]    DOB: [birth year]   Date:08/08/2018       Progress Note  Subjective  Chief Complaint  Chief Complaint  Patient presents with  . URI    seen at [facility] over a week no better  . Sore Throat    HPI  PT presents with concern for sore throat.  He was seen at [facility] over a week ago, and completed a round of Augmentin.  Felt good while taking the Augmentin, but sx's returned 1.5 days after completing abx.  Low-grade fevers up to 99.60F at home, some chills, still having throat pain, and right side of throat is tender, bilateral ear fullness. Has minimal cough, minimal sinus congestion.  No chest pain, or shortness of breath, or NVD  Patient Active Problem List   Diagnosis Date Noted  . GAD (generalized anxiety disorder) 06/06/2018  . Panic attack 06/06/2018  . Nephrolithiasis 03/07/2018  . Umbilical hernia without obstruction and without gangrene 03/07/2018  . Tendinitis of right rotator cuff 01/21/2018  . Tear of right glenoid labrum 01/21/2018  . ADHD 07/26/2016  . Gastroesophageal reflux disease without esophagitis 07/10/2016    Social History   Tobacco Use  . Smoking status: Never Smoker  . Smokeless tobacco: Never Used  Substance Use Topics  . Alcohol use: No     Current Outpatient Medications:  .  EPINEPHrine 0.3 mg/0.3 mL IJ SOAJ injection, , Disp: , Rfl:  .  escitalopram (LEXAPRO) 10 MG tablet, TAKE 1 TABLET(10 MG) BY MOUTH DAILY, Disp: 90 tablet, Rfl: 0 .  hydrOXYzine (ATARAX/VISTARIL) 10 MG tablet, Take 1 tablet (10 mg total) by mouth daily as needed for anxiety., Disp: 30 tablet, Rfl: 0  Allergies  Allergen Reactions  . Buspar [Buspirone]     headache  . Codeine Nausea Only    Other reaction(s): Unknown  . Sulfur     I personally reviewed active problem list, medication list, allergies, health maintenance, lab results with the patient/caregiver today.  ROS  Ten systems reviewed and is negative except as mentioned in  HPI.  Objective  Vitals:   08/08/18 0804  BP: 122/70  Pulse: 87  Resp: 16  Temp: 98 F (36.7 C)  TempSrc: Oral  SpO2: 99%  Weight: 218 lb 9.6 oz (99.2 kg)  Height: 5\' 11"  (1.803 m)   Body mass index is 30.49 kg/m.  Nursing Note and Vital Signs reviewed.  Physical Exam  Constitutional: Patient appears well-developed and well-nourished. Obese No distress.  HEENT: head atraumatic, normocephalic, pupils equal and reactive to light, Bilateral TM's without erythema or effusion,  bilateral maxillary and frontal sinuses are non-tender, neck supple with right sided lymphadenopathy,OP is erythematous without exudate, s/p tonsillectomy Cardiovascular: Normal rate, regular rhythm and normal heart sounds.  No murmur heard. No BLE edema. Pulmonary/Chest: Effort normal and breath sounds clear bilaterally. No respiratory distress. Psychiatric: Patient has a normal mood and affect. behavior is normal. Judgment and thought content normal.   Results for orders placed or performed in visit on 08/08/18 (from the past 72 hour(s))  POCT rapid strep A     Status: None   Collection Time: 08/08/18  8:06 AM  Result Value Ref Range   Rapid Strep A Screen Negative Negative    Assessment & Plan  1. Sore throat - POCT rapid strep A - fluticasone (FLONASE) 50 MCG/ACT nasal spray; Place 2 sprays into both nostrils daily.  Dispense: 16 g; Refill: 6 - sodium chloride (  OCEAN) 0.65 % SOLN nasal spray; Place 1 spray into both nostrils as needed for congestion.  Dispense: 50 mL; Refill: 0 - predniSONE (DELTASONE) 20 MG tablet; Take 1 tablet (20 mg total) by mouth 2 (two) times daily with a meal for 5 days.  Dispense: 10 tablet; Refill: 0 - azithromycin (ZITHROMAX) 250 MG tablet; Day1: Take 2 tablets; Days 2-5:Take 1 tablet once daily  Dispense: 6 tablet; Refill: 0 - magic mouthwash w/lidocaine SOLN; Take 5 mLs by mouth 4 (four) times daily as needed for mouth pain.  Dispense: 100 mL; Refill: 0  2. Nasal  congestion - fluticasone (FLONASE) 50 MCG/ACT nasal spray; Place 2 sprays into both nostrils daily.  Dispense: 16 g; Refill: 6 - sodium chloride (OCEAN) 0.65 % SOLN nasal spray; Place 1 spray into both nostrils as needed for congestion.  Dispense: 50 mL; Refill: 0  3. Pharyngitis, unspecified etiology - predniSONE (DELTASONE) 20 MG tablet; Take 1 tablet (20 mg total) by mouth 2 (two) times daily with a meal for 5 days.  Dispense: 10 tablet; Refill: 0 - azithromycin (ZITHROMAX) 250 MG tablet; Day1: Take 2 tablets; Days 2-5:Take 1 tablet once daily  Dispense: 6 tablet; Refill: 0 - magic mouthwash w/lidocaine SOLN; Take 5 mLs by mouth 4 (four) times daily as needed for mouth pain.  Dispense: 100 mL; Refill: 0  4. Lymphadenopathy - predniSONE (DELTASONE) 20 MG tablet; Take 1 tablet (20 mg total) by mouth 2 (two) times daily with a meal for 5 days.  Dispense: 10 tablet; Refill: 0 - azithromycin (ZITHROMAX) 250 MG tablet; Day1: Take 2 tablets; Days 2-5:Take 1 tablet once daily  Dispense: 6 tablet; Refill: 0  -Red flags and when to present for emergency care or RTC including fever >101.43F, chest pain, shortness of breath, new/worsening/un-resolving symptoms, difficulty swallowing reviewed with patient at time of visit. Follow up and care instructions discussed and provided in AVS.

## 2018-08-08 NOTE — Patient Instructions (Signed)
Cool Mist Vaporizer A cool mist vaporizer is a device that releases a cool mist into the air. If you have a cough or a cold, using a vaporizer may help relieve your symptoms. The mist adds moisture to the air, which may help thin your mucus and make it less sticky. When your mucus is thin and less sticky, it easier for you to breathe and to cough up secretions. Do not use a vaporizer if you are allergic to mold. Follow these instructions at home:  Follow the instructions that come with the vaporizer.  Do not use anything other than distilled water in the vaporizer.  Do not run the vaporizer all of the time. Doing that can cause mold or bacteria to grow in the vaporizer.  Clean the vaporizer after each time that you use it.  Clean and dry the vaporizer well before storing it.  Stop using the vaporizer if your breathing symptoms get worse. This information is not intended to replace advice given to you by your health care provider. Make sure you discuss any questions you have with your health care provider. Document Released: 03/02/2004 Document Revised: 12/24/2015 Document Reviewed: 09/04/2015 Elsevier Interactive Patient Education  2019 Elsevier Inc.  Pharyngitis  Pharyngitis is a sore throat (pharynx). This is when there is redness, pain, and swelling in your throat. Most of the time, this condition gets better on its own. In some cases, you may need medicine. Follow these instructions at home:  Take over-the-counter and prescription medicines only as told by your doctor. ? If you were prescribed an antibiotic medicine, take it as told by your doctor. Do not stop taking the antibiotic even if you start to feel better. ? Do not give children aspirin. Aspirin has been linked to Reye syndrome.  Drink enough water and fluids to keep your pee (urine) clear or pale yellow.  Get a lot of rest.  Rinse your mouth (gargle) with a salt-water mixture 3-4 times a day or as needed. To make a  salt-water mixture, completely dissolve -1 tsp of salt in 1 cup of warm water.  If your doctor approves, you may use throat lozenges or sprays to soothe your throat. Contact a doctor if:  You have large, tender lumps in your neck.  You have a rash.  You cough up green, yellow-brown, or bloody spit. Get help right away if:  You have a stiff neck.  You drool or cannot swallow liquids.  You cannot drink or take medicines without throwing up.  You have very bad pain that does not go away with medicine.  You have problems breathing, and it is not from a stuffy nose.  You have new pain and swelling in your knees, ankles, wrists, or elbows. Summary  Pharyngitis is a sore throat (pharynx). This is when there is redness, pain, and swelling in your throat.  If you were prescribed an antibiotic medicine, take it as told by your doctor. Do not stop taking the antibiotic even if you start to feel better.  Most of the time, pharyngitis gets better on its own. Sometimes, you may need medicine. This information is not intended to replace advice given to you by your health care provider. Make sure you discuss any questions you have with your health care provider. Document Released: 11/22/2007 Document Revised: 07/11/2016 Document Reviewed: 07/11/2016 Elsevier Interactive Patient Education  2019 [employer].

## 2018-08-08 NOTE — Telephone Encounter (Signed)
Spoke with pharmacist and confirmed the RX.  Pharmacist stated that the prescription was complete.

## 2018-08-08 NOTE — Telephone Encounter (Signed)
Copied from CRM [id number]. Topic: Quick Communication - Rx Refill/Question >> Aug 08, 2018  1:45 PM Donita Brooks wrote: Medication: magic mouthwash w/lidocaine SOLN  Has the patient contacted their pharmacy? Yes.    (Agent: If yes, when and what did the pharmacy advise?)Pharmacy is awaiting prescription from doctor  Preferred Pharmacy (with phone number or street name): [id number] [place], [place] - [address] AT [facility] OF [address] [phone] (Phone) [phone] (Fax)    Agent: Please be advised that RX refills may take up to 3 business days. We ask that you follow-up with your pharmacy.

## 2018-09-05 ENCOUNTER — Other Ambulatory Visit: Payer: Self-pay

## 2018-09-05 ENCOUNTER — Ambulatory Visit: Payer: PRIVATE HEALTH INSURANCE | Admitting: Family Medicine

## 2018-09-05 ENCOUNTER — Encounter: Payer: Self-pay | Admitting: Family Medicine

## 2018-09-05 VITALS — BP 116/60 | HR 95 | Temp 98.3°F | Resp 16 | Ht 71.0 in | Wt 222.4 lb

## 2018-09-05 DIAGNOSIS — F411 Generalized anxiety disorder: Secondary | ICD-10-CM

## 2018-09-05 DIAGNOSIS — E669 Obesity, unspecified: Secondary | ICD-10-CM

## 2018-09-05 DIAGNOSIS — F410 Panic disorder [episodic paroxysmal anxiety] without agoraphobia: Secondary | ICD-10-CM

## 2018-09-05 MED ORDER — ESCITALOPRAM OXALATE 10 MG PO TABS: ORAL_TABLET | ORAL | 1 refills | Status: DC
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
## 2019-03-10 ENCOUNTER — Other Ambulatory Visit: Payer: Self-pay

## 2019-03-10 ENCOUNTER — Encounter: Payer: Self-pay | Admitting: Family Medicine

## 2019-03-10 ENCOUNTER — Ambulatory Visit (INDEPENDENT_AMBULATORY_CARE_PROVIDER_SITE_OTHER): Payer: PRIVATE HEALTH INSURANCE | Admitting: Family Medicine

## 2019-03-10 DIAGNOSIS — F411 Generalized anxiety disorder: Secondary | ICD-10-CM

## 2019-03-10 DIAGNOSIS — F410 Panic disorder [episodic paroxysmal anxiety] without agoraphobia: Secondary | ICD-10-CM | POA: Diagnosis not present

## 2019-03-10 DIAGNOSIS — K219 Gastro-esophageal reflux disease without esophagitis: Secondary | ICD-10-CM | POA: Diagnosis not present

## 2019-03-10 MED ORDER — ESCITALOPRAM OXALATE 20 MG PO TABS: ORAL_TABLET | ORAL | 1 refills | Status: DC

## 2019-03-10 MED ORDER — FAMOTIDINE 40 MG PO TABS: 40.0000 mg | ORAL_TABLET | Freq: Every day | ORAL | 1 refills | Status: DC
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
## 2019-06-04 ENCOUNTER — Other Ambulatory Visit: Payer: Self-pay | Admitting: Family Medicine

## 2019-06-04 DIAGNOSIS — F411 Generalized anxiety disorder: Secondary | ICD-10-CM

## 2019-06-04 DIAGNOSIS — F410 Panic disorder [episodic paroxysmal anxiety] without agoraphobia: Secondary | ICD-10-CM

## 2019-09-08 ENCOUNTER — Ambulatory Visit: Payer: PRIVATE HEALTH INSURANCE | Admitting: Family Medicine

## 2019-09-12 ENCOUNTER — Other Ambulatory Visit: Payer: Self-pay | Admitting: Family Medicine

## 2019-09-12 DIAGNOSIS — F410 Panic disorder [episodic paroxysmal anxiety] without agoraphobia: Secondary | ICD-10-CM

## 2019-09-12 DIAGNOSIS — F411 Generalized anxiety disorder: Secondary | ICD-10-CM

## 2019-09-12 NOTE — Telephone Encounter (Signed)
Patient has appointment 09/29/19- 30 day courtesy refill given

## 2019-09-29 ENCOUNTER — Ambulatory Visit: Payer: PRIVATE HEALTH INSURANCE | Admitting: Family Medicine

## 2019-09-29 ENCOUNTER — Other Ambulatory Visit: Payer: Self-pay

## 2019-09-29 ENCOUNTER — Encounter: Payer: Self-pay | Admitting: Family Medicine

## 2019-09-29 VITALS — BP 120/70 | HR 99 | Temp 97.8°F | Resp 18 | Ht 71.0 in | Wt 222.8 lb

## 2019-09-29 DIAGNOSIS — Z6830 Body mass index (BMI) 30.0-30.9, adult: Secondary | ICD-10-CM

## 2019-09-29 DIAGNOSIS — F410 Panic disorder [episodic paroxysmal anxiety] without agoraphobia: Secondary | ICD-10-CM

## 2019-09-29 DIAGNOSIS — F411 Generalized anxiety disorder: Secondary | ICD-10-CM | POA: Diagnosis not present

## 2019-09-29 DIAGNOSIS — L509 Urticaria, unspecified: Secondary | ICD-10-CM

## 2019-09-29 DIAGNOSIS — K9049 Malabsorption due to intolerance, not elsewhere classified: Secondary | ICD-10-CM | POA: Diagnosis not present

## 2019-09-29 DIAGNOSIS — E6609 Other obesity due to excess calories: Secondary | ICD-10-CM

## 2019-09-29 DIAGNOSIS — J302 Other seasonal allergic rhinitis: Secondary | ICD-10-CM

## 2019-09-29 MED ORDER — FLUTICASONE PROPIONATE 50 MCG/ACT NA SUSP: 2.0000 | Freq: Every day | NASAL | 2 refills | Status: DC

## 2019-09-29 MED ORDER — ESCITALOPRAM OXALATE 20 MG PO TABS: ORAL_TABLET | ORAL | 1 refills | Status: DC

## 2019-09-29 NOTE — Progress Notes (Signed)
Name: Patrick Hull   MRN: [id number]    DOB: 1985/11/07   Date:09/29/2019       Progress Note  Subjective  Chief Complaint  Chief Complaint  Patient presents with  . Medication Refill  . GAD (generalized anxiety disorder)  . Gastroesophageal Reflux    Has been controlled     HPI   GAD and panic attacks: started started in Fall of 2018, since Summer of 2019 he has been taking lexapro daily and has controlled his symptoms, he cannot recall his last panic attack and only takes hydroxyzine prn, and none recently . He is compliant with lexapro and is doing well with current dose  GERD: he changed the water bottle he drinks at night and GERD resolved, no problems anymore. Weight is stable. No heartburn or indigestion   AR: seasonal with rhinorrhea, itchy eyes and sore throat/feels dry, no cough or wheezing, taking otc medication, needs refill of flonase  History of hives: he went to [facility] last year , two episodes of hives after his bathroom was remodeled. No problems since last year, however he is also worried because when he drinks beer and liquor he gets red, hot and had an episode of feeling his throat was closing off.   Patient Active Problem List   Diagnosis Date Noted  . GAD (generalized anxiety disorder) 06/06/2018  . Panic attack 06/06/2018  . Nephrolithiasis 03/07/2018  . Umbilical hernia without obstruction and without gangrene 03/07/2018  . Tendinitis of right rotator cuff 01/21/2018  . Tear of right glenoid labrum 01/21/2018  . ADHD 07/26/2016  . Gastroesophageal reflux disease without esophagitis 07/10/2016    Past Surgical History:  Procedure Laterality Date  . TONSILLECTOMY  2007  . VASECTOMY      Family History  Problem Relation Age of Onset  . Hypertension Father   . Stroke Father   . COPD Maternal Grandfather        Tobacco user  . Prostate cancer Neg Hx   . Bladder Cancer Neg Hx   . Kidney cancer Neg Hx     Social History   Tobacco Use  . Smoking  status: Never Smoker  . Smokeless tobacco: Never Used  Substance Use Topics  . Alcohol use: No     Current Outpatient Medications:  .  EPINEPHrine 0.3 mg/0.3 mL IJ SOAJ injection, , Disp: , Rfl:  .  escitalopram (LEXAPRO) 20 MG tablet, TAKE 1 TABLET BY MOUTH DAILY, Disp: 30 tablet, Rfl: 0 .  famotidine (PEPCID) 40 MG tablet, Take 1 tablet (40 mg total) by mouth at bedtime., Disp: 90 tablet, Rfl: 1 .  fluticasone (FLONASE) 50 MCG/ACT nasal spray, Place 2 sprays into both nostrils daily., Disp: 16 g, Rfl: 6 .  hydrOXYzine (ATARAX/VISTARIL) 10 MG tablet, Take 1 tablet (10 mg total) by mouth daily as needed for anxiety., Disp: 30 tablet, Rfl: 0  Allergies  Allergen Reactions  . Codeine Nausea Only    Other reaction(s): Unknown  . Sulfur     I personally reviewed active problem list, medication list, allergies, family history, social history with the patient/caregiver today.   ROS  Constitutional: Negative for fever or weight change.  Respiratory: Negative for cough and shortness of breath.   Cardiovascular: Negative for chest pain or palpitations.  Gastrointestinal: Negative for abdominal pain, no bowel changes.  Musculoskeletal: Negative for gait problem or joint swelling.  Skin: Negative for rash.  Neurological: Negative for dizziness or headache.  No other specific complaints in a  complete review of systems (except as listed in HPI above).  Objective  Vitals:   09/29/19 1153  BP: 120/70  Pulse: 99  Resp: 18  Temp: 97.8 F (36.6 C)  TempSrc: Temporal  SpO2: 97%  Weight: 222 lb 12.8 oz (101.1 kg)  Height: 5\' 11"  (1.803 m)    Body mass index is 31.07 kg/m.  Physical Exam  Constitutional: Patient appears well-developed and well-nourished. Obese  No distress.  HEENT: head atraumatic, normocephalic, pupils equal and reactive to light Cardiovascular: Normal rate, regular rhythm and normal heart sounds.  No murmur heard. No BLE edema. Pulmonary/Chest: Effort normal  and breath sounds normal. No respiratory distress. Abdominal: Soft.  There is no tenderness. Psychiatric: Patient has a normal mood and affect. behavior is normal. Judgment and thought content normal.  PHQ2/9: Depression screen [facility] 2/9 09/29/2019 03/10/2019 08/08/2018 06/06/2018 02/14/2018  Decreased Interest 0 0 0 0 0  Down, Depressed, Hopeless 0 0 0 0 1  PHQ - 2 Score 0 0 0 0 1  Altered sleeping 2 0 0 0 1  Tired, decreased energy 0 1 0 0 0  Change in appetite 0 0 0 0 0  Feeling bad or failure about yourself  0 - 0 0 0  Trouble concentrating 0 0 0 0 0  Moving slowly or fidgety/restless 0 0 0 0 0  Suicidal thoughts 0 0 0 0 0  PHQ-9 Score 2 1 0 0 2  Difficult doing work/chores Not difficult at all Not difficult at all Not difficult at all Not difficult at all Somewhat difficult    phq 9 is negative  Fall Risk: Fall Risk  09/29/2019 03/10/2019 09/05/2018 08/08/2018 06/06/2018  Falls in the past year? 0 0 0 0 0  Number falls in past yr: 0 0 0 0 0  Injury with Fall? 0 0 0 0 0  Follow up - - - Falls evaluation completed -    Functional Status Survey: Is the patient deaf or have difficulty hearing?: No Does the patient have difficulty seeing, even when wearing glasses/contacts?: No Does the patient have difficulty concentrating, remembering, or making decisions?: No Does the patient have difficulty walking or climbing stairs?: No Does the patient have difficulty dressing or bathing?: No Does the patient have difficulty doing errands alone such as visiting a doctor's office or shopping?: No    Assessment & Plan  1. GAD (generalized anxiety disorder)  - escitalopram (LEXAPRO) 20 MG tablet; TAKE 1 TABLET BY MOUTH DAILY  Dispense: 90 tablet; Refill: 1  2. Class 1 obesity due to excess calories without serious comorbidity with body mass index (BMI) of 30.0 to 30.9 in adult  Discussed life style modification    3. Panic attack  - escitalopram (LEXAPRO) 20 MG tablet; TAKE 1 TABLET BY  MOUTH DAILY  Dispense: 90 tablet; Refill: 1  4. Alcohol intolerance  - Ambulatory referral to Immunology  5. Full body hives  - Ambulatory referral to Immunology  6. Seasonal allergies  - fluticasone (FLONASE) 50 MCG/ACT nasal spray; Place 2 sprays into both nostrils daily.  Dispense: 16 g; Refill: 2

## 2019-11-03 IMAGING — CT CT ABD-PELV W/ CM
2 of 4 series · 16 of 46 positions shown, 18 images · IV contrast (iopamidol)
Comparison: None

CLINICAL DATA: Nausea, vomiting, RIGHT lower quadrant pain for 3
days, increased number of bowel movements, RIGHT lower quadrant
abdominal tenderness with rebound, suspected appendicitis

EXAM:
CT ABDOMEN AND PELVIS WITH CONTRAST
TECHNIQUE: Multidetector CT imaging of the abdomen and pelvis was performed
using the standard protocol following bolus administration of
intravenous contrast. Sagittal and coronal MPR images reconstructed
from axial data set.
CONTRAST:  100mL LHQWEL-BDD IOPAMIDOL (LHQWEL-BDD) INJECTION 61% IV.
Dilute oral contrast.

[Series 2: abd pelvis · axial · 0.73mm/px · z∈[-1612,-1187]mm · 13 of 93 slices shown, 15 images (1 of 2)]
[im 4/93  soft-tissue]
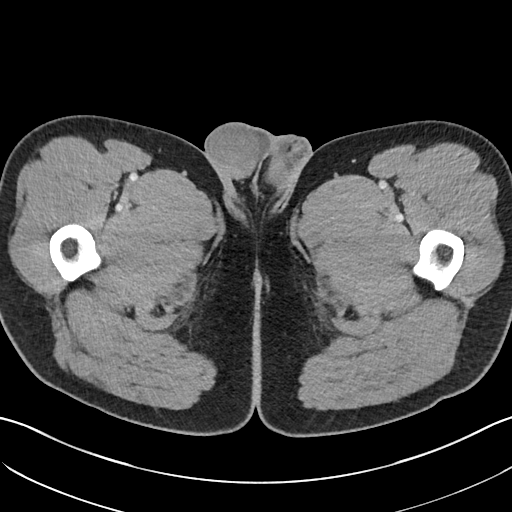
[im 4/93  bone]
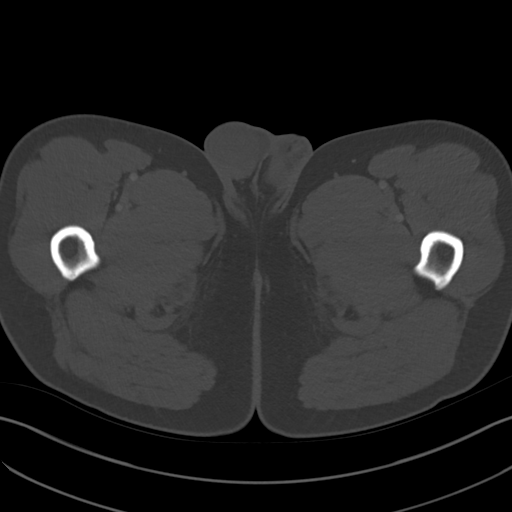
[im 11/93  soft-tissue]
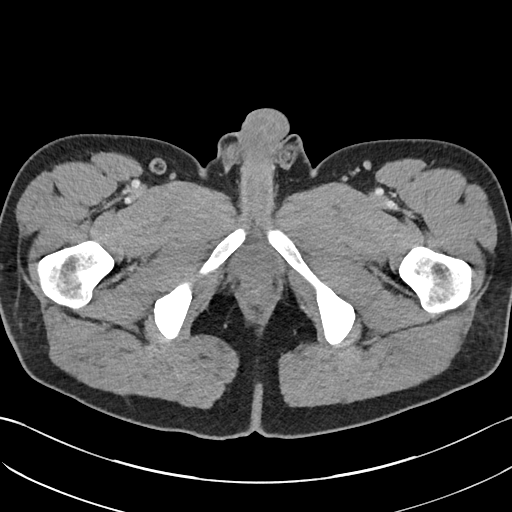
[im 18/93  soft-tissue]
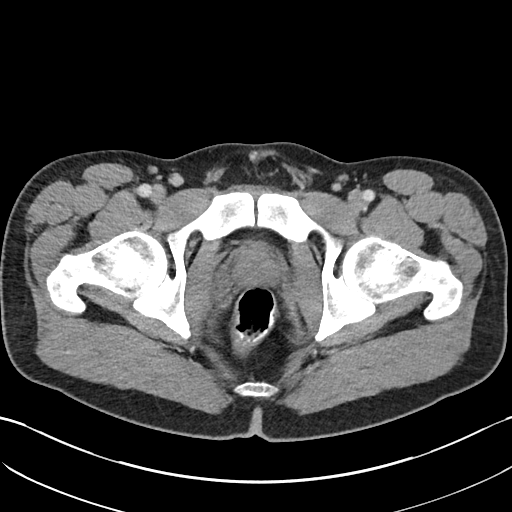
[im 25/93  soft-tissue]
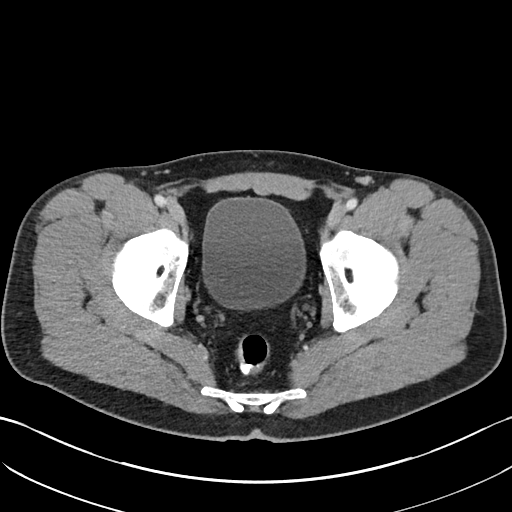
[im 32/93  soft-tissue]
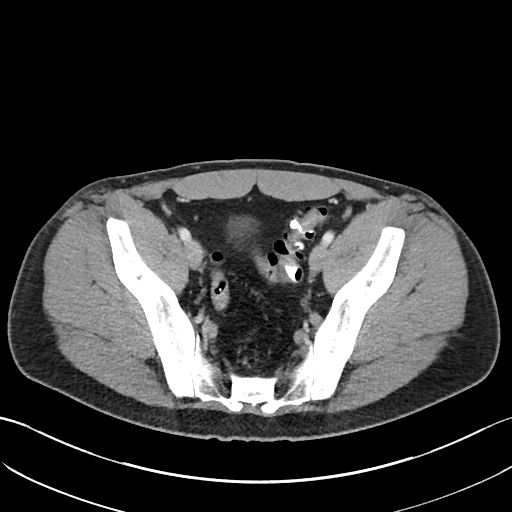
[im 39/93  soft-tissue]
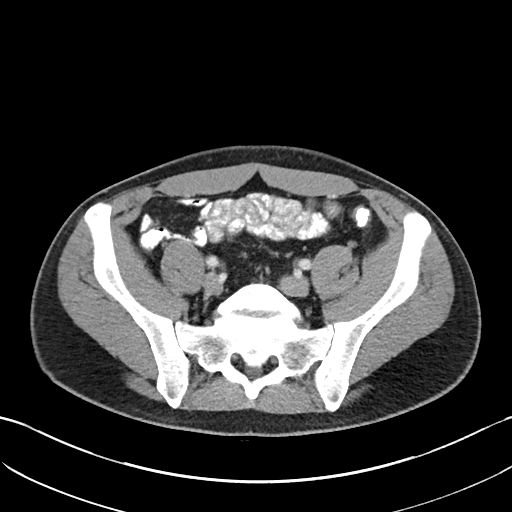
[im 47/93  soft-tissue]
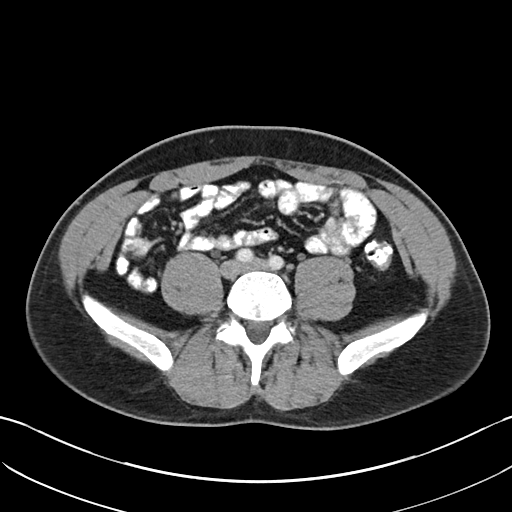
[im 54/93  soft-tissue]
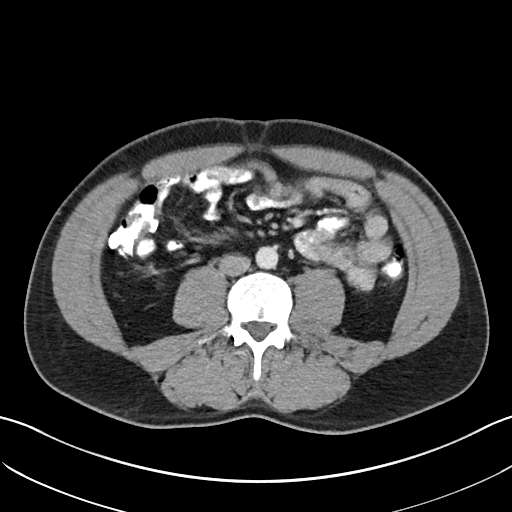
[im 61/93  soft-tissue]
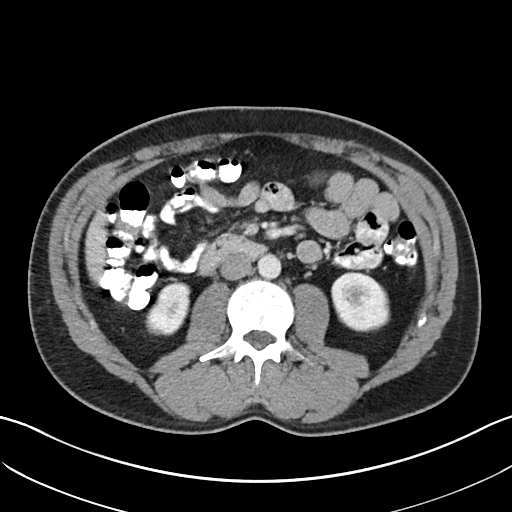
[im 61/93  bone]
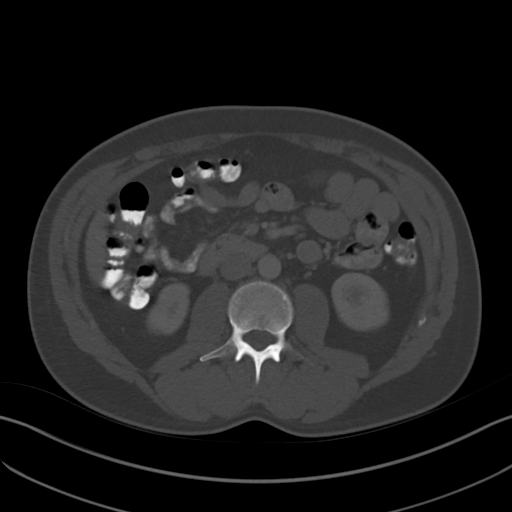
[im 68/93  soft-tissue]
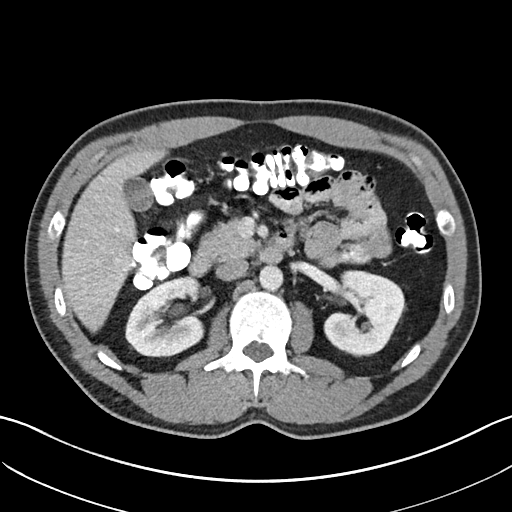
[im 75/93  soft-tissue]
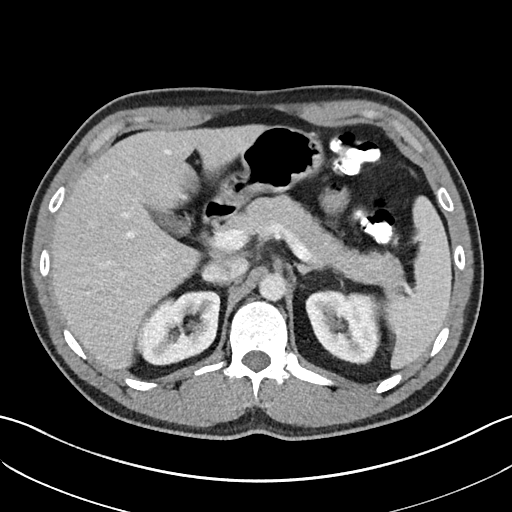
[im 82/93  soft-tissue]
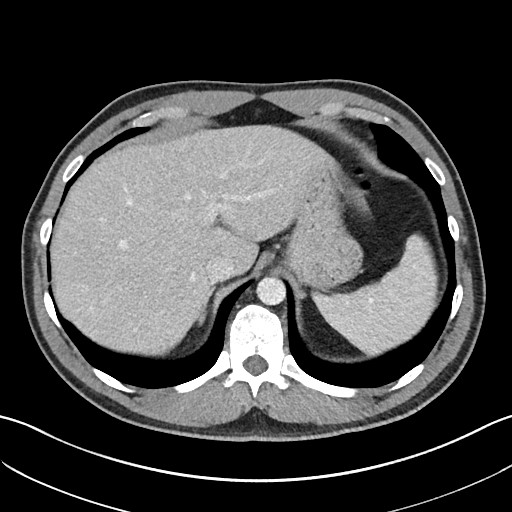
[im 89/93  soft-tissue]
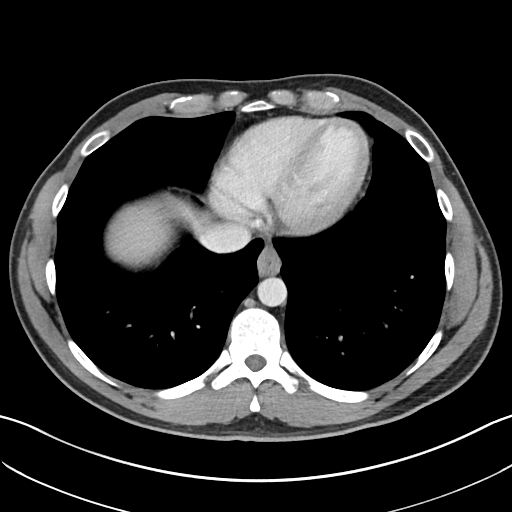

[Series 4: abd pelvis · coronal · 0.73mm/px · 3 of 136 slices shown (2 of 2)]
[im 46/136  soft-tissue]
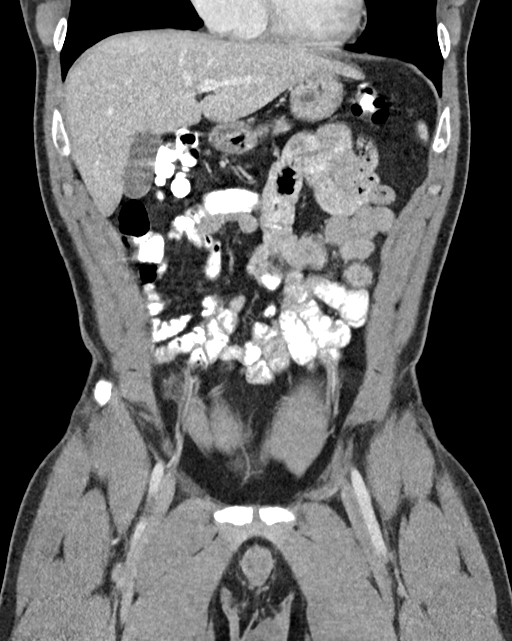
[im 61/136  soft-tissue]
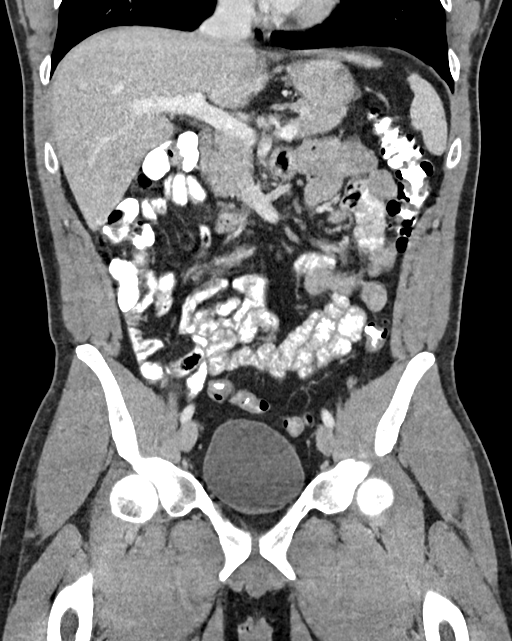
[im 76/136  soft-tissue]
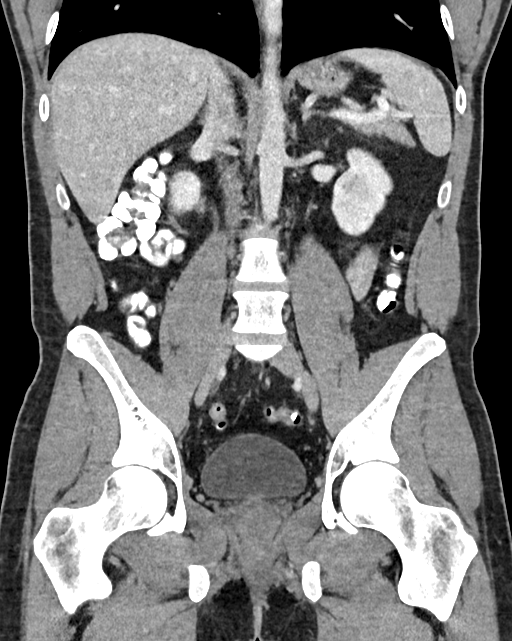

[16 of 46 positions shown; findings below may reference images not displayed]

FINDINGS: Lower chest: Lung bases clear

Hepatobiliary: Gallbladder and liver normal appearance

Pancreas: Normal appearance

Spleen: Normal appearance

Adrenals/Urinary Tract: Adrenal glands normal appearance. Symmetric
nephrograms without renal mass. RIGHT renal collecting system is
minimally prominent in caliber versus LEFT. Additionally, a 2 mm
proximal RIGHT ureteral calculus is identified image 37. No
additional urinary tract calcifications or LEFT ureteral dilatation.
Bladder unremarkable.

Stomach/Bowel: Appendix not definitely visualized but no pericecal
inflammatory process seen. Stomach and bowel loops normal appearance

Vascular/Lymphatic: Vascular structures patent. Scattered normal
size mesenteric lymph nodes. No abdominal or pelvic adenopathy

Reproductive: Unremarkable

Other: Small umbilical hernia containing fat. No free air or free
fluid.

Musculoskeletal: Grade 1 spondylolisthesis L5-S1 secondary to
BILATERAL spondylolysis L5. Associated pseudo disc.
IMPRESSION: 2 mm proximal RIGHT ureteral calculus with minimal RIGHT renal
collecting system dilatation.

Small umbilical hernia containing fat.

Grade 1 spondylolisthesis L5-S1 secondary to BILATERAL spondylolysis
L5.

## 2020-03-29 NOTE — Progress Notes (Deleted)
Name: Patrick Hull   MRN: [id number]    DOB: [birth year]   Date:03/29/2020       Progress Note  Subjective  Chief Complaint  No chief complaint on file.   HPI  GAD and panic attacks: started started in Fall of 2018, since Summer of 2019 he has been taking lexapro daily and has controlled his symptoms,he cannot recall his last panic attack and only takes hydroxyzine prn, and none recently . He is compliant with lexapro and is doing well with current dose  GERD: he changed the water bottle he drinks at night and GERD resolved, no problems anymore. Weight is stable. No heartburn or indigestion   AR: seasonal with rhinorrhea, itchy eyes and sore throat/feels dry, no cough or wheezing, taking otc medication, needs refill of flonase  History of hives: he went to [facility] last year , two episodes of hives after his bathroom was remodeled. No problems since last year, however he is also worried because when he drinks beer and liquor he gets red, hot and had an episode of feeling his throat was closing off.    Patient Active Problem List   Diagnosis Date Noted  . GAD (generalized anxiety disorder) 06/06/2018  . Panic attack 06/06/2018  . Nephrolithiasis 03/07/2018  . Umbilical hernia without obstruction and without gangrene 03/07/2018  . Tendinitis of right rotator cuff 01/21/2018  . Tear of right glenoid labrum 01/21/2018  . ADHD 07/26/2016  . Gastroesophageal reflux disease without esophagitis 07/10/2016    Past Surgical History:  Procedure Laterality Date  . TONSILLECTOMY  2007  . VASECTOMY      Family History  Problem Relation Age of Onset  . Hypertension Father   . Stroke Father   . COPD Maternal Grandfather        Tobacco user  . Prostate cancer Neg Hx   . Bladder Cancer Neg Hx   . Kidney cancer Neg Hx     Social History   Tobacco Use  . Smoking status: Never Smoker  . Smokeless tobacco: Never Used  Substance Use Topics  . Alcohol use: No     Current Outpatient  Medications:  .  EPINEPHrine 0.3 mg/0.3 mL IJ SOAJ injection, , Disp: , Rfl:  .  escitalopram (LEXAPRO) 20 MG tablet, TAKE 1 TABLET BY MOUTH DAILY, Disp: 90 tablet, Rfl: 1 .  fluticasone (FLONASE) 50 MCG/ACT nasal spray, Place 2 sprays into both nostrils daily., Disp: 16 g, Rfl: 2 .  hydrOXYzine (ATARAX/VISTARIL) 10 MG tablet, Take 1 tablet (10 mg total) by mouth daily as needed for anxiety., Disp: 30 tablet, Rfl: 0  Allergies  Allergen Reactions  . Codeine Nausea Only    Other reaction(s): Unknown  . Sulfur     I personally reviewed {Reviewed:14835} with the patient/caregiver today.   ROS  ***  Objective  There were no vitals filed for this visit.  There is no height or weight on file to calculate BMI.  Physical Exam ***  No results found for this or any previous visit (from the past 2160 hour(s)).  Diabetic Foot Exam: Diabetic Foot Exam - Simple   No data filed     ***  PHQ2/9: Depression screen [facility] 2/9 09/29/2019 03/10/2019 08/08/2018 06/06/2018 02/14/2018  Decreased Interest 0 0 0 0 0  Down, Depressed, Hopeless 0 0 0 0 1  PHQ - 2 Score 0 0 0 0 1  Altered sleeping 2 0 0 0 1  Tired, decreased energy 0 1 0 0 0  Change in appetite 0 0 0 0 0  Feeling bad or failure about yourself  0 - 0 0 0  Trouble concentrating 0 0 0 0 0  Moving slowly or fidgety/restless 0 0 0 0 0  Suicidal thoughts 0 0 0 0 0  PHQ-9 Score 2 1 0 0 2  Difficult doing work/chores Not difficult at all Not difficult at all Not difficult at all Not difficult at all Somewhat difficult    phq 9 is {gen pos [id number]} ***  Fall Risk: Fall Risk  09/29/2019 03/10/2019 09/05/2018 08/08/2018 06/06/2018  Falls in the past year? 0 0 0 0 0  Number falls in past yr: 0 0 0 0 0  Injury with Fall? 0 0 0 0 0  Follow up - - - Falls evaluation completed -   ***   Functional Status Survey:   ***   Assessment & Plan  *** There are no diagnoses linked to this encounter.

## 2020-03-30 ENCOUNTER — Ambulatory Visit: Payer: PRIVATE HEALTH INSURANCE | Admitting: Family Medicine

## 2021-05-20 NOTE — Progress Notes (Signed)
Name: Patrick Hull   MRN: [id number]    DOB: [birth year]   Date:05/23/2021       Progress Note  Subjective  Chief Complaint  Medication Refill  HPI  GAD and panic attacks: started started in Fall of 2018, in the Summer of 2019 he started taking Lexapro daily and it helped with symptoms, he lost to follow up and ran out of medications, he was doing well for months, however a few months ago he noticed increase in anxiety and panic attacks got progressively worse, currently having panic attacks every other day or so. He states he needs to call his wife and if around the house he goes play with his kids Episodes don't need a specific trigger.    GERD: he states he stopped drinking Acquafina water and symptoms resolved.   AR: only seasonally and not taking any medications at this time, usually takes otc medication.   History of hives: he went to [facility] 2020, two episodes of hives after his bathroom was remodeled. No problems since last year, however he is also worried because when he drinks beer and liquor he gets red, hot and had an episode of feeling his throat was closing off. No episodes since, but he still has epipen at home. He thinks was secondary to alcohol intake. He is not drinking at all, even a sip can cause a reaction   Patient Active Problem List   Diagnosis Date Noted   GAD (generalized anxiety disorder) 06/06/2018   Panic attack 06/06/2018   Nephrolithiasis 03/07/2018   Umbilical hernia without obstruction and without gangrene 03/07/2018   Tendinitis of right rotator cuff 01/21/2018   Tear of right glenoid labrum 01/21/2018   ADHD 07/26/2016   Gastroesophageal reflux disease without esophagitis 07/10/2016    Past Surgical History:  Procedure Laterality Date   TONSILLECTOMY  2007   VASECTOMY      Family History  Problem Relation Age of Onset   Hypertension Father    Stroke Father    COPD Maternal Grandfather        Tobacco user   Prostate cancer Neg Hx    Bladder  Cancer Neg Hx    Kidney cancer Neg Hx     Social History   Tobacco Use   Smoking status: Never   Smokeless tobacco: Never  Substance Use Topics   Alcohol use: No     Current Outpatient Medications:    EPINEPHrine 0.3 mg/0.3 mL IJ SOAJ injection, , Disp: , Rfl:    escitalopram (LEXAPRO) 20 MG tablet, TAKE 1 TABLET BY MOUTH DAILY (Patient not taking: Reported on 05/23/2021), Disp: 90 tablet, Rfl: 1   fluticasone (FLONASE) 50 MCG/ACT nasal spray, Place 2 sprays into both nostrils daily. (Patient not taking: Reported on 05/23/2021), Disp: 16 g, Rfl: 2   hydrOXYzine (ATARAX/VISTARIL) 10 MG tablet, Take 1 tablet (10 mg total) by mouth daily as needed for anxiety. (Patient not taking: Reported on 05/23/2021), Disp: 30 tablet, Rfl: 0  Allergies  Allergen Reactions   Codeine Nausea Only    Other reaction(s): Unknown   Elemental Sulfur     I personally reviewed active problem list, medication list, allergies, family history, social history, health maintenance with the patient/caregiver today.   ROS  Constitutional: Negative for fever or weight change.  Respiratory: Negative for cough and shortness of breath.   Cardiovascular: Negative for chest pain or palpitations.  Gastrointestinal: Negative for abdominal pain, no bowel changes.  Musculoskeletal: Negative for gait problem or joint  swelling.  Skin: Negative for rash.  Neurological: Negative for dizziness or headache.  No other specific complaints in a complete review of systems (except as listed in HPI above).   Objective  Vitals:   05/23/21 0829  BP: 132/70  Pulse: 86  Resp: 16  Temp: 98.1 F (36.7 C)  SpO2: 99%  Weight: 206 lb (93.4 kg)  Height: 5\' 11"  (1.803 m)    Body mass index is 28.73 kg/m.  Physical Exam  Constitutional: Patient appears well-developed and well-nourished. Overweight.  No distress.  HEENT: head atraumatic, normocephalic, pupils equal and reactive to light, neck supple Cardiovascular: Normal  rate, regular rhythm and normal heart sounds.  No murmur heard. No BLE edema. Pulmonary/Chest: Effort normal and breath sounds normal. No respiratory distress. Abdominal: Soft.  There is no tenderness. Psychiatric: Patient has a normal mood and affect. behavior is normal. Judgment and thought content normal.    PHQ2/9: Depression screen [facility] 2/9 05/23/2021 09/29/2019 03/10/2019 08/08/2018 06/06/2018  Decreased Interest 0 0 0 0 0  Down, Depressed, Hopeless 0 0 0 0 0  PHQ - 2 Score 0 0 0 0 0  Altered sleeping 2 2 0 0 0  Tired, decreased energy 0 0 1 0 0  Change in appetite 0 0 0 0 0  Feeling bad or failure about yourself  0 0 - 0 0  Trouble concentrating 0 0 0 0 0  Moving slowly or fidgety/restless 0 0 0 0 0  Suicidal thoughts 0 0 0 0 0  PHQ-9 Score 2 2 1  0 0  Difficult doing work/chores - Not difficult at all Not difficult at all Not difficult at all Not difficult at all    phq 9 is negative   Fall Risk: Fall Risk  05/23/2021 09/29/2019 03/10/2019 09/05/2018 08/08/2018  Falls in the past year? 0 0 0 0 0  Number falls in past yr: 0 0 0 0 0  Injury with Fall? 0 0 0 0 0  Risk for fall due to : No Fall Risks - - - -  Follow up Falls prevention discussed - - - Falls evaluation completed     Functional Status Survey: Is the patient deaf or have difficulty hearing?: No Does the patient have difficulty seeing, even when wearing glasses/contacts?: No Does the patient have difficulty concentrating, remembering, or making decisions?: No Does the patient have difficulty walking or climbing stairs?: No Does the patient have difficulty dressing or bathing?: No Does the patient have difficulty doing errands alone such as visiting a doctor's office or shopping?: No    Assessment & Plan  1. GAD (generalized anxiety disorder)  - escitalopram (LEXAPRO) 10 MG tablet; Take 1 tablet (10 mg total) by mouth daily. TAKE 1 TABLET BY MOUTH DAILY  Dispense: 90 tablet; Refill: 0  2. Panic attack  -  escitalopram (LEXAPRO) 10 MG tablet; Take 1 tablet (10 mg total) by mouth daily. TAKE 1 TABLET BY MOUTH DAILY  Dispense: 90 tablet; Refill: 0 - hydrOXYzine (ATARAX) 10 MG tablet; Take 1 tablet (10 mg total) by mouth daily as needed for anxiety.  Dispense: 30 tablet; Refill: 0  3. Seasonal allergies   4. Hives

## 2021-05-23 ENCOUNTER — Ambulatory Visit: Payer: No Typology Code available for payment source | Admitting: Family Medicine

## 2021-05-23 ENCOUNTER — Encounter: Payer: Self-pay | Admitting: Family Medicine

## 2021-05-23 VITALS — BP 132/70 | HR 86 | Temp 98.1°F | Resp 16 | Ht 71.0 in | Wt 206.0 lb

## 2021-05-23 DIAGNOSIS — L509 Urticaria, unspecified: Secondary | ICD-10-CM

## 2021-05-23 DIAGNOSIS — F411 Generalized anxiety disorder: Secondary | ICD-10-CM

## 2021-05-23 DIAGNOSIS — F410 Panic disorder [episodic paroxysmal anxiety] without agoraphobia: Secondary | ICD-10-CM

## 2021-05-23 DIAGNOSIS — J302 Other seasonal allergic rhinitis: Secondary | ICD-10-CM | POA: Diagnosis not present

## 2021-05-23 MED ORDER — ESCITALOPRAM OXALATE 10 MG PO TABS: 10.0000 mg | ORAL_TABLET | Freq: Every day | ORAL | 0 refills | Status: DC

## 2021-05-23 MED ORDER — HYDROXYZINE HCL 10 MG PO TABS: 10.0000 mg | ORAL_TABLET | Freq: Every day | ORAL | 0 refills | Status: DC | PRN

## 2021-07-20 ENCOUNTER — Other Ambulatory Visit: Payer: Self-pay | Admitting: Family Medicine

## 2021-07-20 DIAGNOSIS — F410 Panic disorder [episodic paroxysmal anxiety] without agoraphobia: Secondary | ICD-10-CM

## 2021-08-17 ENCOUNTER — Other Ambulatory Visit: Payer: Self-pay | Admitting: Family Medicine

## 2021-08-17 DIAGNOSIS — F410 Panic disorder [episodic paroxysmal anxiety] without agoraphobia: Secondary | ICD-10-CM

## 2021-08-18 NOTE — Progress Notes (Signed)
Name: Patrick Hull   MRN: [id number]    DOB: 1985-09-17   Date:08/19/2021 ? ?     Progress Note ? ?Subjective ? ?Chief Complaint ? ?Follow Up ? ?HPI ? ?GAD and panic attacks: started started in Fall of 2018, in the Summer of 2019 he started taking Lexapro daily and it helped with symptoms, he lost to follow up and ran out of medications, he had recurrence of panic attacks Fall of 2022 , and came back in we gave him Lexapro and hydroxyzine, He states feeling much better now, he has a lot going on , he goes to work from  4am to 5 pm, second job from 5: 30 to 9 pm and also on weekends , after that he goes watch the kids sports,  he is able to function and no recent episode of panic attack ?  ?GERD: he stopped drinking Acquafina water and symptoms resolved ? ?AR: only seasonally and not taking any medications at this time, usually takes otc medication.  ? ?History of hives: he went to [facility] 2020, two episodes of hives after his bathroom was remodeled. No problems since last year, however he is also worried because when he drinks beer and liquor he gets red, hot and had an episode of feeling his throat was closing off. No episodes since, but he still has epipen at home. He thinks was secondary to alcohol intake He stopped drinking any alcohol and is doing well  ? ?ADHD: he was evaluated by a psychologist in the past  , Hector Shade, and used to take stimulant, she has been off of medication for years. He states he has been under more stress, building a new house, kids doing a different sports, feeling like his mind scattered all the time. Discussed trying to avoid stimulants and try Intuniv first to see if it will help after that we can try Strattera . He agrees  ? ?Patient Active Problem List  ? Diagnosis Date Noted  ? Hives 05/23/2021  ? GAD (generalized anxiety disorder) 06/06/2018  ? Panic attack 06/06/2018  ? History of kidney stones 03/07/2018  ? Umbilical hernia without obstruction and without gangrene 03/07/2018  ? ADHD  07/26/2016  ? Gastroesophageal reflux disease without esophagitis 07/10/2016  ? ? ?Past Surgical History:  ?Procedure Laterality Date  ? TONSILLECTOMY  2007  ? VASECTOMY    ? ? ?Family History  ?Problem Relation Age of Onset  ? Hypertension Father   ? Stroke Father   ? COPD Maternal Grandfather   ?     Tobacco user  ? Prostate cancer Neg Hx   ? Bladder Cancer Neg Hx   ? Kidney cancer Neg Hx   ? ? ?Social History  ? ?Tobacco Use  ? Smoking status: Never  ? Smokeless tobacco: Never  ?Substance Use Topics  ? Alcohol use: No  ? ? ? ?Current Outpatient Medications:  ?  EPINEPHrine 0.3 mg/0.3 mL IJ SOAJ injection, , Disp: , Rfl:  ?  escitalopram (LEXAPRO) 10 MG tablet, Take 1 tablet (10 mg total) by mouth daily. TAKE 1 TABLET BY MOUTH DAILY, Disp: 90 tablet, Rfl: 0 ?  hydrOXYzine (ATARAX) 10 MG tablet, TAKE 1 TABLET(10 MG) BY MOUTH DAILY AS NEEDED FOR ANXIETY (Patient not taking: Reported on 08/19/2021), Disp: 30 tablet, Rfl: 0 ? ?Allergies  ?Allergen Reactions  ? Codeine Nausea Only  ?  Other reaction(s): Unknown  ? Elemental Sulfur   ? ? ?I personally reviewed active problem list, medication list,  allergies, family history, social history, health maintenance with the patient/caregiver today. ? ? ?ROS ? ?Constitutional: Negative for fever or weight change.  ?Respiratory: Negative for cough and shortness of breath.   ?Cardiovascular: Negative for chest pain or palpitations.  ?Gastrointestinal: Negative for abdominal pain, no bowel changes.  ?Musculoskeletal: Negative for gait problem or joint swelling.  ?Skin: Negative for rash.  ?Neurological: Negative for dizziness or headache.  ?No other specific complaints in a complete review of systems (except as listed in HPI above).  ? ?Objective ? ?Vitals:  ? 08/19/21 1021  ?BP: 124/68  ?Pulse: 90  ?Resp: 16  ?SpO2: 97%  ?Weight: 211 lb (95.7 kg)  ?Height: 5\' 11"  (1.803 m)  ? ? ?Body mass index is 29.43 kg/m?. ? ?Physical Exam ? ?Constitutional: Patient appears well-developed and  well-nourished.  No distress.  ?HEENT: head atraumatic, normocephalic, pupils equal and reactive to light, neck supple ?Cardiovascular: Normal rate, regular rhythm and normal heart sounds.  No murmur heard. No BLE edema. ?Pulmonary/Chest: Effort normal and breath sounds normal. No respiratory distress. ?Abdominal: Soft.  There is no tenderness. ?Psychiatric: Patient has a normal mood and affect. behavior is normal. Judgment and thought content normal.  ? ?PHQ2/9: ?Depression screen [facility] 2/9 08/19/2021 05/23/2021 09/29/2019 03/10/2019 08/08/2018  ?Decreased Interest 0 0 0 0 0  ?Down, Depressed, Hopeless 0 0 0 0 0  ?PHQ - 2 Score 0 0 0 0 0  ?Altered sleeping 0 2 2 0 0  ?Tired, decreased energy 0 0 0 1 0  ?Change in appetite 0 0 0 0 0  ?Feeling bad or failure about yourself  0 0 0 - 0  ?Trouble concentrating 0 0 0 0 0  ?Moving slowly or fidgety/restless 0 0 0 0 0  ?Suicidal thoughts 0 0 0 0 0  ?PHQ-9 Score 0 2 2 1  0  ?Difficult doing work/chores - - Not difficult at all Not difficult at all Not difficult at all  ?  ?phq 9 is negative ? ? ?Fall Risk: ?Fall Risk  08/19/2021 05/23/2021 09/29/2019 03/10/2019 09/05/2018  ?Falls in the past year? 0 0 0 0 0  ?Number falls in past yr: 0 0 0 0 0  ?Injury with Fall? 0 0 0 0 0  ?Risk for fall due to : No Fall Risks No Fall Risks - - -  ?Follow up Falls prevention discussed Falls prevention discussed - - -  ? ? ? ? ?Functional Status Survey: ?Is the patient deaf or have difficulty hearing?: No ?Does the patient have difficulty seeing, even when wearing glasses/contacts?: No ?Does the patient have difficulty concentrating, remembering, or making decisions?: Yes ?Does the patient have difficulty walking or climbing stairs?: No ?Does the patient have difficulty dressing or bathing?: No ?Does the patient have difficulty doing errands alone such as visiting a doctor's office or shopping?: No ? ? ? ?Assessment & Plan ? ?1. GAD (generalized anxiety disorder) ? ?- escitalopram (LEXAPRO) 10 MG tablet;  Take 1 tablet (10 mg total) by mouth daily. TAKE 1 TABLET BY MOUTH DAILY  Dispense: 90 tablet; Refill: 1 ? ?2. Panic attack ? ?- escitalopram (LEXAPRO) 10 MG tablet; Take 1 tablet (10 mg total) by mouth daily. TAKE 1 TABLET BY MOUTH DAILY  Dispense: 90 tablet; Refill: 1 ? ?3. Attention deficit hyperactivity disorder (ADHD), predominantly inattentive type ? ?- guanFACINE (INTUNIV) 1 MG TB24 ER tablet; Take 1-2 tablets (1-2 mg total) by mouth daily.  Dispense: 30 tablet; Refill: 0 ? ?4. Alcohol intolerance ?  ?  ?

## 2021-08-19 ENCOUNTER — Ambulatory Visit: Payer: No Typology Code available for payment source | Admitting: Family Medicine

## 2021-08-19 ENCOUNTER — Encounter: Payer: Self-pay | Admitting: Family Medicine

## 2021-08-19 VITALS — BP 124/68 | HR 90 | Resp 16 | Ht 71.0 in | Wt 211.0 lb

## 2021-08-19 DIAGNOSIS — F411 Generalized anxiety disorder: Secondary | ICD-10-CM | POA: Diagnosis not present

## 2021-08-19 DIAGNOSIS — F900 Attention-deficit hyperactivity disorder, predominantly inattentive type: Secondary | ICD-10-CM | POA: Diagnosis not present

## 2021-08-19 DIAGNOSIS — F410 Panic disorder [episodic paroxysmal anxiety] without agoraphobia: Secondary | ICD-10-CM | POA: Diagnosis not present

## 2021-08-19 DIAGNOSIS — K9049 Malabsorption due to intolerance, not elsewhere classified: Secondary | ICD-10-CM

## 2021-08-19 MED ORDER — ESCITALOPRAM OXALATE 10 MG PO TABS: 10.0000 mg | ORAL_TABLET | Freq: Every day | ORAL | 1 refills | Status: AC

## 2021-08-19 MED ORDER — GUANFACINE HCL ER 1 MG PO TB24: 1.0000 mg | ORAL_TABLET | Freq: Every day | ORAL | 0 refills | Status: AC

## 2021-09-05 ENCOUNTER — Other Ambulatory Visit: Payer: Self-pay | Admitting: Family Medicine

## 2021-09-05 DIAGNOSIS — F410 Panic disorder [episodic paroxysmal anxiety] without agoraphobia: Secondary | ICD-10-CM

## 2021-09-05 DIAGNOSIS — F411 Generalized anxiety disorder: Secondary | ICD-10-CM

## 2021-12-06 ENCOUNTER — Ambulatory Visit: Payer: No Typology Code available for payment source | Admitting: Family Medicine

## 2021-12-28 NOTE — Progress Notes (Deleted)
Name: Patrick Hull   MRN: [id number]    DOB: 10/02/1985   Date:12/28/2021       Progress Note  Subjective  Chief Complaint  Follow Up  HPI  GAD and panic attacks: started started in Fall of 2018, in the Summer of 2019 he started taking Lexapro daily and it helped with symptoms, he lost to follow up and ran out of medications, he had recurrence of panic attacks Fall of 2022 , and came back in we gave him Lexapro and hydroxyzine, He states feeling much better now, he has a lot going on , he goes to work from  4am to 5 pm, second job from 5: 30 to 9 pm and also on weekends , after that he goes watch the kids sports,  he is able to function and no recent episode of panic attack   GERD: he stopped drinking Acquafina water and symptoms resolved  AR: only seasonally and not taking any medications at this time, usually takes otc medication.   History of hives: he went to [facility] 2020, two episodes of hives after his bathroom was remodeled. No problems since last year, however he is also worried because when he drinks beer and liquor he gets red, hot and had an episode of feeling his throat was closing off. No episodes since, but he still has epipen at home. He thinks was secondary to alcohol intake He stopped drinking any alcohol and is doing well   ADHD: he was evaluated by a psychologist in the past  , Hector Shade, and used to take stimulant, she has been off of medication for years. He states he has been under more stress, building a new house, kids doing a different sports, feeling like his mind scattered all the time. Discussed trying to avoid stimulants and try Intuniv first to see if it will help after that we can try Strattera . He agrees   Patient Active Problem List   Diagnosis Date Noted   Hives 05/23/2021   GAD (generalized anxiety disorder) 06/06/2018   Panic attack 06/06/2018   History of kidney stones 03/07/2018   Umbilical hernia without obstruction and without gangrene 03/07/2018    ADHD 07/26/2016   Gastroesophageal reflux disease without esophagitis 07/10/2016    Past Surgical History:  Procedure Laterality Date   TONSILLECTOMY  2007   VASECTOMY      Family History  Problem Relation Age of Onset   Hypertension Father    Stroke Father    COPD Maternal Grandfather        Tobacco user   Prostate cancer Neg Hx    Bladder Cancer Neg Hx    Kidney cancer Neg Hx     Social History   Tobacco Use   Smoking status: Never   Smokeless tobacco: Never  Substance Use Topics   Alcohol use: No     Current Outpatient Medications:    EPINEPHrine 0.3 mg/0.3 mL IJ SOAJ injection, , Disp: , Rfl:    escitalopram (LEXAPRO) 10 MG tablet, Take 1 tablet (10 mg total) by mouth daily. TAKE 1 TABLET BY MOUTH DAILY, Disp: 90 tablet, Rfl: 1   guanFACINE (INTUNIV) 1 MG TB24 ER tablet, Take 1-2 tablets (1-2 mg total) by mouth daily., Disp: 30 tablet, Rfl: 0   hydrOXYzine (ATARAX) 10 MG tablet, TAKE 1 TABLET(10 MG) BY MOUTH DAILY AS NEEDED FOR ANXIETY (Patient not taking: Reported on 08/19/2021), Disp: 30 tablet, Rfl: 0  Allergies  Allergen Reactions   Codeine Nausea  Only    Other reaction(s): Unknown   Elemental Sulfur     I personally reviewed active problem list, medication list, allergies, family history, social history, health maintenance with the patient/caregiver today.   ROS  ***  Objective  There were no vitals filed for this visit.  There is no height or weight on file to calculate BMI.  Physical Exam ***  No results found for this or any previous visit (from the past 2160 hour(s)).   PHQ2/9:    08/19/2021   10:20 AM 05/23/2021    8:29 AM 09/29/2019   11:56 AM 03/10/2019    7:44 AM 08/08/2018    8:06 AM  Depression screen PHQ 2/9  Decreased Interest 0 0 0 0 0  Down, Depressed, Hopeless 0 0 0 0 0  PHQ - 2 Score 0 0 0 0 0  Altered sleeping 0 2 2 0 0  Tired, decreased energy 0 0 0 1 0  Change in appetite 0 0 0 0 0  Feeling bad or failure about yourself   0 0 0  0  Trouble concentrating 0 0 0 0 0  Moving slowly or fidgety/restless 0 0 0 0 0  Suicidal thoughts 0 0 0 0 0  PHQ-9 Score 0 2 2 1  0  Difficult doing work/chores   Not difficult at all Not difficult at all Not difficult at all    phq 9 is {gen pos   Fall Risk:    08/19/2021   10:20 AM 05/23/2021    8:29 AM 09/29/2019   11:56 AM 03/10/2019    7:42 AM 09/05/2018    7:55 AM  Fall Risk   Falls in the past year? 0 0 0 0 0  Number falls in past yr: 0 0 0 0 0  Injury with Fall? 0 0 0 0 0  Risk for fall due to : No Fall Risks No Fall Risks     Follow up Falls prevention discussed Falls prevention discussed         Functional Status Survey:      Assessment & Plan  *** There are no diagnoses linked to this encounter.

## 2022-01-02 ENCOUNTER — Ambulatory Visit: Payer: No Typology Code available for payment source | Admitting: Family Medicine
